# Patient Record
Sex: Male | Born: 1960 | Race: White | Hispanic: No | Marital: Married | Smoking: Current every day smoker
Health system: Southern US, Community
[De-identification: ages and names within clinical notes are randomized; demographics above are authoritative.]

## PROBLEM LIST (undated history)

## (undated) DIAGNOSIS — T7840XA Allergy, unspecified, initial encounter: Secondary | ICD-10-CM

## (undated) DIAGNOSIS — J449 Chronic obstructive pulmonary disease, unspecified: Secondary | ICD-10-CM

## (undated) DIAGNOSIS — F17200 Nicotine dependence, unspecified, uncomplicated: Secondary | ICD-10-CM

## (undated) DIAGNOSIS — Z973 Presence of spectacles and contact lenses: Secondary | ICD-10-CM

## (undated) DIAGNOSIS — I2510 Atherosclerotic heart disease of native coronary artery without angina pectoris: Secondary | ICD-10-CM

## (undated) DIAGNOSIS — M1990 Unspecified osteoarthritis, unspecified site: Secondary | ICD-10-CM

## (undated) DIAGNOSIS — I7090 Unspecified atherosclerosis: Secondary | ICD-10-CM

## (undated) HISTORY — PX: LACERATION REPAIR: SHX5168

## (undated) HISTORY — DX: Nicotine dependence, unspecified, uncomplicated: F17.200

## (undated) HISTORY — DX: Unspecified osteoarthritis, unspecified site: M19.90

## (undated) HISTORY — PX: TONSILLECTOMY AND ADENOIDECTOMY: SHX28

## (undated) HISTORY — DX: Allergy, unspecified, initial encounter: T78.40XA

## (undated) HISTORY — PX: COLONOSCOPY: SHX174

## (undated) HISTORY — DX: Chronic obstructive pulmonary disease, unspecified: J44.9

## (undated) HISTORY — DX: Atherosclerotic heart disease of native coronary artery without angina pectoris: I25.10

## (undated) HISTORY — DX: Presence of spectacles and contact lenses: Z97.3

## (undated) HISTORY — PX: APPENDECTOMY: SHX54

## (undated) HISTORY — DX: Unspecified atherosclerosis: I70.90

---

## 2002-05-05 ENCOUNTER — Ambulatory Visit (HOSPITAL_COMMUNITY): Admission: RE | Admit: 2002-05-05 | Discharge: 2002-05-05 | Payer: Self-pay | Admitting: Internal Medicine

## 2002-05-05 ENCOUNTER — Encounter: Payer: Self-pay | Admitting: Internal Medicine

## 2002-05-20 ENCOUNTER — Observation Stay (HOSPITAL_COMMUNITY): Admission: RE | Admit: 2002-05-20 | Discharge: 2002-05-21 | Payer: Self-pay | Admitting: General Surgery

## 2007-04-09 ENCOUNTER — Ambulatory Visit (HOSPITAL_COMMUNITY): Admission: RE | Admit: 2007-04-09 | Discharge: 2007-04-09 | Payer: Self-pay | Admitting: Family Medicine

## 2007-08-07 HISTORY — PX: CERVICAL FUSION: SHX112

## 2007-08-07 HISTORY — PX: CHOLECYSTECTOMY: SHX55

## 2008-11-03 ENCOUNTER — Ambulatory Visit (HOSPITAL_COMMUNITY): Admission: RE | Admit: 2008-11-03 | Discharge: 2008-11-04 | Payer: Self-pay | Admitting: Orthopaedic Surgery

## 2010-11-16 LAB — CBC
HCT: 47.4 % (ref 39.0–52.0)
Hemoglobin: 16.6 g/dL (ref 13.0–17.0)
RDW: 14.3 % (ref 11.5–15.5)
WBC: 9.8 10*3/uL (ref 4.0–10.5)

## 2010-11-16 LAB — COMPREHENSIVE METABOLIC PANEL
Albumin: 4 g/dL (ref 3.5–5.2)
BUN: 11 mg/dL (ref 6–23)
Chloride: 104 mEq/L (ref 96–112)
Creatinine, Ser: 1.09 mg/dL (ref 0.4–1.5)
Glucose, Bld: 83 mg/dL (ref 70–99)
Total Bilirubin: 1.3 mg/dL — ABNORMAL HIGH (ref 0.3–1.2)
Total Protein: 6.9 g/dL (ref 6.0–8.3)

## 2010-11-16 LAB — URINALYSIS, ROUTINE W REFLEX MICROSCOPIC
Hgb urine dipstick: NEGATIVE
Protein, ur: NEGATIVE mg/dL
Specific Gravity, Urine: 1.015 (ref 1.005–1.030)
Urobilinogen, UA: 1 mg/dL (ref 0.0–1.0)

## 2010-12-19 NOTE — Op Note (Signed)
NAME:  Cameron Mendoza, Cameron Mendoza NO.:  [id number]   MEDICAL RECORD NO.:  [id number]          PATIENT TYPE:  OIB   LOCATION:  5024                         FACILITY:  [facility]   PHYSICIAN:  Mark C. Ophelia Charter, M.D.    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   PROCEDURE:  Left C6-7 anterior cervical diskectomy and fusion, iliac  crest graft.  Cloward technique.   SURGEON:  Mark C. Ophelia Charter, MD   ASSISTANT:  Skip Mayer, PA-C   ANESTHESIA:  General plus 15 mL Marcaine local.   DRAINS:  One Hemovac neck.   BRIEF HISTORY:  A 50 year old male with left C6-7 HNP, radiculopathy,  progressive left triceps weakness, uncontrolled pain despite round-the-  clock pain medication and progressive loss of function of left wrist  with decreased grip strength and decreased wrist flexion strength on the  left.  MRI scan showed large C6-7 HNP with compression.   PROCEDURE:  After induction of general anesthesia, 2-pound sandbag  underneath the neck after intubation, arms tucked to the side with pads  over the ulnar nerve, gel bag underneath the buttocks, left iliac crest,  and neck after application had halter traction without weight was  performed.  Sterile skin marker was used in a primary skin fold and  Betadine biodrape application, sterile Mayo stand at the head, and  thyroid sheet and drape.  Iliac crest and neck had been sealed with  Betadine Steri-Drapes.  Surgical time-out checklist was completed.  Ancef 2 g given prophylactically.  Incision was made to midline,  extended to the left.  Platysma was divided in line of the fibers.  Blunt dissection down to the level of longus coli muscle was performed.  Large spur at C5-6 was noted due to the patient's thick shoulder, short  neck and  size of the needle was first placed at 45 short 25 needle.  Initial x-ray confirmed this.  Going down 2 levels, it was difficult to  find C5-6 due to the large spurs, which were regular and the disk space  was not able to be palpated with the needle.  C6-7 was found also had  some osteophytes anteriorly partially over it.  Due to the gap from C4-5  down to C6-7, two or three additional x-rays were taken and finally x-  ray showed a needle at C4-5.  After removal of the anterior spurs with a  Beyer rongeur, short 25 needle in the C5-6 disk space and then a  Penfield 4 was placed in the C6-7 space.  After microscope was draped  and brought in, the self-retaining Cloward retractors were placed with  smooth blade on the right side, teeth blades on the left, due to the  depth of the hole the deepest teeth blades would not catch the longus  coli well.  Smooth blades were placed up and down, deepest available.  Diskectomy was performed finishing up at C6-7.  Posterior longitudinal  ligament was taken down.  Operative microscope was used for  visualization.  Cloward drill was gradually drilled back to the  posterior cortex, then down slightly with the bur.  Depth gauge  measurements were made.  Extruded disk material through the ligament was  teased out.  Left paracentral at the level of the disk without cephalad  or caudad migration, some of the fragments were a little bit further out  to the left laterally.  Uncovertebral joints were stripped and 1-10 mm  Kerrisons were used to enlarge and remove spurs at the entry into the  foramina.  This was performed on the right side as well.  Dura was  completely visualized all the way across the 12-mm hole and 2-3 mm  lateral into both gutters.  After irrigation with saline solution, 14-mm  plug was harvested from the left iliac crest splitting the fascia in  line with the fibers with initial incision transverse in line with ASIS  2 cm inferior.  Plug was  cut, trimmed, bullet-nosed slightly based on  depth gauge measurement and it was 12-14 mm plug.  It was inserted,  countersunk a millimeter and good stability.  There was right and left  regress of fluids.  After irrigation with saline solution, Hemovac was  placed with in-and-out technique.  There was some mild bleeding noted on  the medial aspect of the wound from a small artery come off the thyroid,  which was coagulated with the bipolar.  It had been preserved at the  beginning of the case, but somewhere in the procedure with manipulation  it detached immediately.  Operative wound was dry.  Hemovac was placed  in an in-and-out technique, 3-0 Vicryl in platysma and 4-0 Vicryl  subcuticular skin closure.  Tincture of Benzoin, Steri-Strips, Marcaine  infiltration, postop dressing.  Iliac crest closed with 0 Vicryl  interrupted in the deep fascia, 2-0 Vicryl subcutaneous tissue, 3-0  Vicryl subcuticular closure.  Tincture of Benzoin, Steri-Strips,  Marcaine infiltration on both incisions, postop dressings, soft cervical  collar, and transferred to recovery room.      Mark C. Ophelia Charter, M.D.  Electronically Signed     MCY/MEDQ  D:  11/03/2008  T:  11/04/2008  Job:  [id number]

## 2010-12-22 NOTE — Op Note (Signed)
NAME:  Cameron Mendoza, Cameron Mendoza NO.:  [id number]   MEDICAL RECORD NO.:  [id number]                   PATIENT TYPE:   LOCATION:                                       FACILITY:  [facility]   PHYSICIAN:  Barbaraann Barthel, MD                DATE OF BIRTH:   DATE OF PROCEDURE:  05/20/2002  DATE OF DISCHARGE:                                 OPERATIVE REPORT   PREOPERATIVE DIAGNOSIS:  Cholecystitis with either cholelithiasis or  gallbladder polyps.   POSTOPERATIVE DIAGNOSIS:  Cholecystitis with either cholelithiasis or  gallbladder polyps.   PROCEDURE:  Laparoscopic cholecystectomy   SURGEON:  Barbaraann Barthel, MD   ASSISTANT:  Tilda Burrow, M.D.   SPECIMEN:  Gallbladder.   INDICATIONS:  This is a 50 year old white male who had multiple episode of  right upper quadrant pain with nausea.  He was seen by the medical service  and referred to me after sonogram revealed the presence of either  cholelithiasis versus small polyps within his gallbladder.  We placed him on  a restricted diet.  We checked his liver function studies and amylase, all  of which were within normal limits; and we planned for elective  cholecystectomy.  We discussed the complications of laparoscopic  cholecystectomy, including, but not limited to bleeding and infection;  damage to bile ducts;  perforation of organs; and transitory diarrhea;  informed consent was obtained.   GROSS OPERATIVE FINDINGS:  The patient had a very fatty, infiltrated  gallbladder, and a small cystic duct which was not cannulated.  The right  upper quadrant otherwise appeared to be normal.  The gallbladder was not  distended and there were moderate adhesions about it.   TECHNIQUE:  The patient was placed in the supine position and after the  adequate administration of general anesthesia via endotracheal intubation, a  Foley catheter was aseptically inserted; and the abdomen was prepped with  Betadine solution and  draped in the usual manner.  A periumbilical incision  was carried out over the superior aspect of the umbilicus, and with the  patient in Trendelenburg, the fascia was grasped with a sharp towel clip.  A  Veress needle was inserted through the fascia and confirmed in position with  the saline drop test.  The abdomen was then insufflated with CO2 to  approximately 3.5 liters.  We then used the Visiport technique and under  direct vision we placed an 11 cannula in the umbilicus incision.  Then under  direct vision with the camera in place 3 other cannulas were placed; an 11-  mm cannula in the epigastrium and two 5-mm cannulas in right upper quadrant  laterally.   The gallbladder was grasped.  Its adhesions were taken down.  The cystic  duct was isolated, clearly visualized, triply silver clipped on the side of  the common bile duct, and singly silver clipped on the side of the  gallbladder and divided.  The cystic artery was also visualized and triply  silver clipped and divided; and the gallbladder was then removed.  There  were some small adhesions which were clipped around the edge of the  gallbladder as well.  The gallbladder was then removed using the hook  cautery device.  This was done uneventfully.  There was minimal oozing from  the gallbladder.  A small amount of oozing near the fundus of the  gallbladder, I elected to leave after cauterizing a piece of Surgicel in  this area.   After irrigating and checking for hemostasis, the gallbladder was then  desufflated and the cannula sites were the 11-mm cannulas were placed were  repaired suturing the fascia with #0 Polysorb sutures in interrupted  fashion.  I used approximately 4 or 5 cc of 1/2% Sensorcaine around these  incisions to help with postoperative pain as well.  All incisions were then  closed with surgical clips and a sterile dressing was applied.  No drains  were placed.   Prior to closure, all sponge, needle, and  instrument counts were found to be  correct.  Estimated blood loss was minimal.  The patient received 1750 cc of  crystalloids intraoperatively.  There were no complications.                                               Barbaraann Barthel, MD    WB/MEDQ  D:  05/20/2002  T:  05/20/2002  Job:  [id number]   cc:   Madelin Rear. Sherwood Gambler, M.D.  [address]  [place]  [place] [address]  Fax: [phone]   Tilda Burrow, M.D.

## 2016-10-12 DIAGNOSIS — J40 Bronchitis, not specified as acute or chronic: Secondary | ICD-10-CM | POA: Diagnosis not present

## 2016-10-12 DIAGNOSIS — J111 Influenza due to unidentified influenza virus with other respiratory manifestations: Secondary | ICD-10-CM | POA: Diagnosis not present

## 2018-05-08 ENCOUNTER — Ambulatory Visit (INDEPENDENT_AMBULATORY_CARE_PROVIDER_SITE_OTHER): Payer: BLUE CROSS/BLUE SHIELD | Admitting: Medical

## 2018-05-08 ENCOUNTER — Encounter: Payer: Self-pay | Admitting: Medical

## 2018-05-08 VITALS — BP 130/90 | HR 92 | Temp 97.8°F | Resp 16 | Ht 71.5 in | Wt 231.4 lb

## 2018-05-08 DIAGNOSIS — Z125 Encounter for screening for malignant neoplasm of prostate: Secondary | ICD-10-CM | POA: Diagnosis not present

## 2018-05-08 DIAGNOSIS — Z129 Encounter for screening for malignant neoplasm, site unspecified: Secondary | ICD-10-CM

## 2018-05-08 DIAGNOSIS — Z136 Encounter for screening for cardiovascular disorders: Secondary | ICD-10-CM | POA: Diagnosis not present

## 2018-05-08 DIAGNOSIS — Z1211 Encounter for screening for malignant neoplasm of colon: Secondary | ICD-10-CM

## 2018-05-08 DIAGNOSIS — E669 Obesity, unspecified: Secondary | ICD-10-CM

## 2018-05-08 DIAGNOSIS — Z23 Encounter for immunization: Secondary | ICD-10-CM | POA: Diagnosis not present

## 2018-05-08 DIAGNOSIS — R0989 Other specified symptoms and signs involving the circulatory and respiratory systems: Secondary | ICD-10-CM | POA: Insufficient documentation

## 2018-05-08 DIAGNOSIS — Z720 Tobacco use: Secondary | ICD-10-CM | POA: Insufficient documentation

## 2018-05-08 DIAGNOSIS — F17200 Nicotine dependence, unspecified, uncomplicated: Secondary | ICD-10-CM | POA: Diagnosis not present

## 2018-05-08 DIAGNOSIS — Z0000 Encounter for general adult medical examination without abnormal findings: Secondary | ICD-10-CM | POA: Diagnosis not present

## 2018-05-08 DIAGNOSIS — R490 Dysphonia: Secondary | ICD-10-CM

## 2018-05-08 DIAGNOSIS — Z1159 Encounter for screening for other viral diseases: Secondary | ICD-10-CM | POA: Diagnosis not present

## 2018-05-08 LAB — POCT URINALYSIS DIP (PROADVANTAGE DEVICE)
Bilirubin, UA: NEGATIVE
Blood, UA: NEGATIVE
Glucose, UA: NEGATIVE mg/dL
Ketones, POC UA: NEGATIVE mg/dL
LEUKOCYTES UA: NEGATIVE
Nitrite, UA: NEGATIVE
PH UA: 6 (ref 5.0–8.0)
PROTEIN UA: NEGATIVE mg/dL
Specific Gravity, Urine: 1.02
Urobilinogen, Ur: NEGATIVE

## 2018-05-08 NOTE — Patient Instructions (Addendum)
It was a pleasure meeting you today  Since you have not had a physical or health care in a while, you are due for several screenings and recommendations as below  Given history of tobacco use, we updated your flu and pneumonia vaccines today  I strongly recommend you quit smoking  I recommend you call your insurance to see if they will cover a chest CT lung cancer screening.  If they will let me know and we will set this up  I am concerned about your lung sounds and your hoarse voice  When someone has a chronically hoarse voice we will usually refer to ear nose and throat to do an evaluation to make sure there is no tumor or other abnormalities of the vocal cords.  Consider this and let me know about possible referral  Your lung sounds or abnormal, and I suspect you may have some lung disease related to smoking  Let us plan to have you return at your convenience to do a breathing test called spirometry to check your lung volumes  If your insurance does not cover the lung CT screening we will plan to do a chest x-ray   We are checking routine labs today including diabetes screen, cholesterol screen, hepatitis screen.  We will call with lab results   Please call your insurance to see if they cover Cologuard or colonoscopy.  If your blood work comes back showing no anemia then you may do either test which ever you prefer   Vaccines: Shingles vaccine:  I recommend you have a shingles vaccine to help prevent shingles or herpes zoster outbreak.   Please call your insurer to inquire about coverage for the Shingrix vaccine given in 2 doses.   Some insurers cover this vaccine after age 18, some cover this after age 55.  If your insurer covers this, then call to schedule appointment to have this vaccine here.  At your convenience, you can return for tetanus booster.        Yearly screenings See your eye doctor yearly for routine vision care. See your dentist yearly for routine dental care  including hygiene visits twice yearly. See me here yearly for a routine physical and preventative care visit     Please follow up yearly for a physical.   Preventative Care for Adults - Male      MAINTAIN REGULAR HEALTH EXAMS:  A routine yearly physical is a good way to check in with your primary care provider about your health and preventive screening. It is also an opportunity to share updates about your health and any concerns you have, and receive a thorough all-over exam.   Most health insurance companies pay for at least some preventative services.  Check with your health plan for specific coverages.  WHAT PREVENTATIVE SERVICES DO WOMEN NEED?  Adult men should have their weight and blood pressure checked regularly.   Men age 39 and older should have their cholesterol levels checked regularly.  Beginning at age 77 and continuing to age 15, men should be screened for colorectal cancer.  Certain people may need continued testing until age 75.  Updating vaccinations is part of preventative care.  Vaccinations help protect against diseases such as the flu.  Osteoporosis is a disease in which the bones lose minerals and strength as we age. Men ages 81 and over should discuss this with their caregivers  Lab tests are generally done as part of preventative care to screen for anemia and blood disorders,  to screen for problems with the kidneys and liver, to screen for bladder problems, to check blood sugar, and to check your cholesterol level.  Preventative services generally include counseling about diet, exercise, avoiding tobacco, drugs, excessive alcohol consumption, and sexually transmitted infections.    GENERAL RECOMMENDATIONS FOR GOOD HEALTH:  Healthy diet:  Eat a variety of foods, including fruit, vegetables, animal or vegetable protein, such as meat, fish, chicken, and eggs, or beans, lentils, tofu, and grains, such as rice.  Drink plenty of water daily.  Decrease  saturated fat in the diet, avoid lots of red meat, processed foods, sweets, fast foods, and fried foods.  Exercise:  Aerobic exercise helps maintain good heart health. At least 30-40 minutes of moderate-intensity exercise is recommended. For example, a brisk walk that increases your heart rate and breathing. This should be done on most days of the week.   Find a type of exercise or a variety of exercises that you enjoy so that it becomes a part of your daily life.  Examples are running, walking, swimming, water aerobics, and biking.  For motivation and support, explore group exercise such as aerobic class, spin class, Zumba, Yoga,or  martial arts, etc.    Set exercise goals for yourself, such as a certain weight goal, walk or run in a race such as a 5k walk/run.  Speak to your primary care provider about exercise goals.  Disease prevention:  If you smoke or chew tobacco, find out from your caregiver how to quit. It can literally save your life, no matter how long you have been a tobacco user. If you do not use tobacco, never begin.   Maintain a healthy diet and normal weight. Increased weight leads to problems with blood pressure and diabetes.   The Body Mass Index or BMI is a way of measuring how much of your body is fat. Having a BMI above 27 increases the risk of heart disease, diabetes, hypertension, stroke and other problems related to obesity. Your caregiver can help determine your BMI and based on it develop an exercise and dietary program to help you achieve or maintain this important measurement at a healthful level.  High blood pressure causes heart and blood vessel problems.  Persistent high blood pressure should be treated with medicine if weight loss and exercise do not work.   Fat and cholesterol leaves deposits in your arteries that can block them. This causes heart disease and vessel disease elsewhere in your body.  If your cholesterol is found to be high, or if you have heart  disease or certain other medical conditions, then you may need to have your cholesterol monitored frequently and be treated with medication.   Ask if you should have a cardiac stress test if your history suggests this. A stress test is a test done on a treadmill that looks for heart disease. This test can find disease prior to there being a problem.  Osteoporosis is a disease in which the bones lose minerals and strength as we age. This can result in serious bone fractures. Risk of osteoporosis can be identified using a bone density scan. Men ages 22 and over should discuss this with their caregivers. Ask your caregiver whether you should be taking a calcium supplement and Vitamin D, to reduce the rate of osteoporosis.   Avoid drinking alcohol in excess (more than two drinks per day).  Avoid use of street drugs. Do not share needles with anyone. Ask for professional help if you need  assistance or instructions on stopping the use of alcohol, cigarettes, and/or drugs.  Brush your teeth twice a day with fluoride toothpaste, and floss once a day. Good oral hygiene prevents tooth decay and gum disease. The problems can be painful, unattractive, and can cause other health problems. Visit your dentist for a routine oral and dental check up and preventive care every 6-12 months.   Look at your skin regularly.  Use a mirror to look at your back. Notify your caregivers of changes in moles, especially if there are changes in shapes, colors, a size larger than a pencil eraser, an irregular border, or development of new moles.  Safety:  Use seatbelts 100% of the time, whether driving or as a passenger.  Use safety devices such as hearing protection if you work in environments with loud noise or significant background noise.  Use safety glasses when doing any work that could send debris in to the eyes.  Use a helmet if you ride a bike or motorcycle.  Use appropriate safety gear for contact sports.  Talk to your  caregiver about gun safety.  Use sunscreen with a SPF (or skin protection factor) of 15 or greater.  Lighter skinned people are at a greater risk of skin cancer. Don't forget to also wear sunglasses in order to protect your eyes from too much damaging sunlight. Damaging sunlight can accelerate cataract formation.   Practice safe sex. Use condoms. Condoms are used for birth control and to help reduce the spread of sexually transmitted infections (or STIs).  Some of the STIs are gonorrhea (the clap), chlamydia, syphilis, trichomonas, herpes, HPV (human papilloma virus) and HIV (human immunodeficiency virus) which causes AIDS. The herpes, HIV and HPV are viral illnesses that have no cure. These can result in disability, cancer and death.   Keep carbon monoxide and smoke detectors in your home functioning at all times. Change the batteries every 6 months or use a model that plugs into the wall.   Vaccinations:  Stay up to date with your tetanus shots and other required immunizations. You should have a booster for tetanus every 10 years. Be sure to get your flu shot every year, since 5%-20% of the [place] population comes down with the flu. The flu vaccine changes each year, so being vaccinated once is not enough. Get your shot in the fall, before the flu season peaks.   Other vaccines to consider:  Human Papilloma Virus or HPV causes cancer of the cervix, and other infections that can be transmitted from person to person. There is a vaccine for HPV, and males should get immunized between the ages of 65 and 81. It requires a series of 3 shots.   Pneumococcal vaccine to protect against certain types of pneumonia.  This is normally recommended for adults age 55 or older.  However, adults younger than 58 years old with certain underlying conditions such as diabetes, heart or lung disease should also receive the vaccine.  Shingles vaccine to protect against Varicella Zoster if you are older than age 64, or  younger than 58 years old with certain underlying illness.  If you have not had the Shingrix vaccine, please call your insurer to inquire about coverage for the Shingrix vaccine given in 2 doses.   Some insurers cover this vaccine after age 58, some cover this after age 56.  If your insurer covers this, then call to schedule appointment to have this vaccine here  Hepatitis A vaccine to protect against a form  of infection of the liver by a virus acquired from food.  Hepatitis B vaccine to protect against a form of infection of the liver by a virus acquired from blood or body fluids, particularly if you work in health care.  If you plan to travel internationally, check with your local health department for specific vaccination recommendations.   What should I know about cancer screening? Many types of cancers can be detected early and may often be prevented. Lung Cancer  You should be screened every year for lung cancer if: ? You are a current smoker who has smoked for at least 30 years. ? You are a former smoker who has quit within the past 15 years.  Talk to your health care provider about your screening options, when you should start screening, and how often you should be screened.  Colorectal Cancer  Routine colorectal cancer screening usually begins at 58 years of age and should be repeated every 5-10 years until you are 58 years old. You may need to be screened more often if early forms of precancerous polyps or small growths are found. Your health care provider may recommend screening at an earlier age if you have risk factors for colon cancer.  Your health care provider may recommend using home test kits to check for hidden blood in the stool.  A small camera at the end of a tube can be used to examine your colon (sigmoidoscopy or colonoscopy). This checks for the earliest forms of colorectal cancer.  Prostate and Testicular Cancer  Depending on your age and overall health, your  health care provider may do certain tests to screen for prostate and testicular cancer.  Talk to your health care provider about any symptoms or concerns you have about testicular or prostate cancer.  Skin Cancer  Check your skin from head to toe regularly.  Tell your health care provider about any new moles or changes in moles, especially if: ? There is a change in a mole's size, shape, or color. ? You have a mole that is larger than a pencil eraser.  Always use sunscreen. Apply sunscreen liberally and repeat throughout the day.  Protect yourself by wearing long sleeves, pants, a wide-brimmed hat, and sunglasses when outside.

## 2018-05-08 NOTE — Progress Notes (Signed)
Subjective:   HPI  Cameron Mendoza is a 57 y.o. male who presents for new patient physical Chief Complaint  Patient presents with  . NP    NP CPE non fasting    eye exam 2019   Has not been to a doctor in a while no physical in probably 10 years.  Medical care team includes: Tysinger, Kermit Balo, PA-C here for primary care Dentist Eye doctor  Concerns: None particularly, wife made him come in  He notes in remote past possibly being told he had hepatitis, but no prior therapy  Reviewed their medical, surgical, family, social, medication, and allergy history and updated chart as appropriate.  Past Medical History:  Diagnosis Date  . Allergy   . Arthritis   . Smoker   . Wears glasses      Social History   Socioeconomic History  . Marital status: Married    Spouse name: Not on file  . Number of children: 4  . Years of education: Not on file  . Highest education level: Not on file  Occupational History  . Occupation: Retail banker    Comment: Engineer, water  Social Needs  . Financial resource strain: Not on file  . Food insecurity:    Worry: Not on file    Inability: Not on file  . Transportation needs:    Medical: Not on file    Non-medical: Not on file  Tobacco Use  . Smoking status: Current Every Day Smoker    Packs/day: 1.50    Years: 25.00    Pack years: 37.50  . Smokeless tobacco: Never Used  Substance and Sexual Activity  . Alcohol use: Yes    Alcohol/week: 2.0 standard drinks    Types: 2 Cans of beer per week  . Drug use: Never  . Sexual activity: Not on file  Lifestyle  . Physical activity:    Days per week: 3 days    Minutes per session: 30 min  . Stress: Not on file  Relationships  . Social connections:    Talks on phone: Not on file    Gets together: Not on file    Attends religious service: Not on file    Active member of club or organization: Not on file    Attends meetings of clubs or organizations: Not on file    Relationship  status: Not on file  . Intimate partner violence:    Fear of current or ex partner: Not on file    Emotionally abused: Not on file    Physically abused: Not on file    Forced sexual activity: Not on file  Other Topics Concern  . Not on file  Social History Narrative   [employer]    Family History  Problem Relation Age of Onset  . COPD Mother   . Hypertension Mother   . Diabetes Father   . Heart disease Father 63       CHF  . Diabetes Brother     No current outpatient medications on file.  Allergies  Allergen Reactions  . Penicillins Rash     Review of Systems Constitutional: -fever, -chills, -sweats, -unexpected weight change, -decreased appetite, -fatigue Allergy: -sneezing, -itching, -congestion Dermatology: -changing moles, --rash, -lumps ENT: -runny nose, -ear pain, -sore throat, -hoarseness, -sinus pain, -teeth pain, - ringing in ears, -hearing loss, -nosebleeds Cardiology: -chest pain, -palpitations, -swelling, -difficulty breathing when lying flat, -waking up short of breath Respiratory: -cough, -shortness of breath, -difficulty breathing with exercise or  exertion, -wheezing, -coughing up blood Gastroenterology: -abdominal pain, -nausea, -vomiting, -diarrhea, -constipation, -blood in stool, -changes in bowel movement, -difficulty swallowing or eating Hematology: -bleeding, -bruising  Musculoskeletal: -joint aches, -muscle aches, -joint swelling, -back pain, -neck pain, -cramping, -changes in gait Ophthalmology: denies vision changes, eye redness, itching, discharge Urology: -burning with urination, -difficulty urinating, -blood in urine, -urinary frequency, -urgency, -incontinence Neurology: -headache, -weakness, -tingling, -numbness, -memory loss, -falls, -dizziness Psychology: -depressed mood, -agitation, -sleep problems Male GU: no testicular mass, pain, no lymph nodes swollen, no swelling, no rash.     Objective:  BP 130/90   Pulse 92   Temp 97.8 F (36.6  C) (Oral)   Resp 16   Ht 5' 11.5" (1.816 m)   Wt 231 lb 6.4 oz (105 kg)   SpO2 96%   BMI 31.82 kg/m   General appearance: alert, no distress, WD/WN, Caucasian male, somewhat hoarse voice Skin: few scattered macules, no worrisome lesions, pink/red telangectasia of right cheek unchanged per patient for >1 year HEENT: normocephalic, conjunctiva/corneas normal, sclerae anicteric, PERRLA, EOMi, nares patent, no discharge or erythema, pharynx normal Oral cavity: MMM, tongue normal, teeth in good repair Neck: supple, no lymphadenopathy, no thyromegaly, no masses, normal ROM, no bruits Chest: non tender, normal shape and expansion Heart: RRR, normal S1, S2, no murmurs Lungs: decreased lung sounds and faint generalized wheezes, no rhonchi or rales Abdomen: +bs, soft, non tender, non distended, no masses, no hepatomegaly, no splenomegaly, no bruits Back: non tender, normal ROM, no scoliosis Musculoskeletal: upper extremities non tender, no obvious deformity, normal ROM throughout, lower extremities non tender, no obvious deformity, normal ROM throughout Extremities: no edema, no cyanosis, no clubbing Pulses: 2+ symmetric, upper and lower extremities, normal cap refill Neurological: alert, oriented x 3, CN2-12 intact, strength normal upper extremities and lower extremities, sensation normal throughout, DTRs 2+ throughout, no cerebellar signs, gait normal Psychiatric: normal affect, behavior normal, pleasant  GU: normal male external genitalia,circumcised, nontender, no masses, no hernia, no lymphadenopathy Rectal: anus normal tone, prostate moderately enlarged, no nodules   Adult ECG Report  Indication: physical  Rate: 85 bpm  Rhythm: normal sinus rhythm  QRS Axis: -13 degrees  PR Interval:  QRS Duration:  QTc:  Conduction Disturbances: incomplete RBBB  Other Abnormalities: none  Patient's cardiac risk factors are: male gender, obesity (BMI >= 30 kg/m2) and smoking/  tobacco exposure.  EKG comparison: none  Narrative Interpretation: incomplete RBBB     Assessment and Plan :   Encounter Diagnoses  Name Primary?  . Routine general medical examination at a health care facility Yes  . Smoker   . Screening for prostate cancer   . Screen for colon cancer   . Abnormal lung sounds   . Hoarse voice quality   . Need for influenza vaccination   . Need for pneumococcal vaccination   . Screening for heart disease   . Obesity with serious comorbidity, unspecified classification, unspecified obesity type   . Encounter for hepatitis C screening test for low risk patient   . Screening for cancer     Physical exam - discussed and counseled on healthy lifestyle, diet, exercise, preventative care, vaccinations, sick and well care, proper use of emergency dept and after hours care, and addressed their concerns.    Health screening: Given exam findings or tobacco history, consider AAA screening.  We discussed the following: Since you have not had a physical or health care in a while, you are due for several screenings and  recommendations as below  Given history of tobacco use, we updated your flu and pneumonia vaccines today  I strongly recommend you quit smoking  I recommend you call your insurance to see if they will cover a chest CT lung cancer screening.  If they will let me know and we will set this up  I am concerned about your lung sounds and your hoarse voice  When someone has a chronically hoarse voice we will usually refer to ear nose and throat to do an evaluation to make sure there is no tumor or other abnormalities of the vocal cords.  Consider this and let me know about possible referral  Your lung sounds or abnormal, and I suspect you may have some lung disease related to smoking  Let us plan to have you return at your convenience to do a breathing test called spirometry to check your lung volumes  If your insurance does not cover the lung  CT screening we will plan to do a chest x-ray   We are checking routine labs today including diabetes screen, cholesterol screen, hepatitis screen.  We will call with lab results   Please call your insurance to see if they cover Cologuard or colonoscopy.  If your blood work comes back showing no anemia then you may do either test which ever you prefer   Vaccines: Shingles vaccine:  I recommend you have a shingles vaccine to help prevent shingles or herpes zoster outbreak.   Please call your insurer to inquire about coverage for the Shingrix vaccine given in 2 doses.   Some insurers cover this vaccine after age 37, some cover this after age 86.  If your insurer covers this, then call to schedule appointment to have this vaccine here.  At your convenience, you can return for tetanus booster.     Counseled on the influenza virus vaccine.  Vaccine information sheet given.  Influenza vaccine given after consent obtained.  Counseled on the pneumococcal vaccine.  Vaccine information sheet given.  Pneumococcal vaccine PPSV23 given after consent obtained.   Meldrick was seen today for np.  Diagnoses and all orders for this visit:  Routine general medical examination at a health care facility -     POCT Urinalysis DIP (Proadvantage Device) -     Comprehensive metabolic panel -     CBC with Differential/Platelet -     Lipid panel -     PSA -     Hemoglobin A1c -     EKG 12-Lead -     HIV Antibody (routine testing w rflx) -     Hepatitis C antibody -     Hepatitis B surface antigen  Smoker  Screening for prostate cancer -     PSA  Screen for colon cancer  Abnormal lung sounds  Hoarse voice quality  Need for influenza vaccination -     Flu Vaccine QUAD 6+ mos PF IM (Fluarix Quad PF)  Need for pneumococcal vaccination -     Pneumococcal polysaccharide vaccine 23-valent greater than or equal to 2yo subcutaneous/IM  Screening for heart disease -     EKG 12-Lead  Obesity with  serious comorbidity, unspecified classification, unspecified obesity type -     Hemoglobin A1c  Encounter for hepatitis C screening test for low risk patient -     Hepatitis C antibody  Screening for cancer    Follow-up pending labs, yearly for physical

## 2018-05-09 ENCOUNTER — Other Ambulatory Visit: Payer: Self-pay | Admitting: Medical

## 2018-05-09 DIAGNOSIS — Z122 Encounter for screening for malignant neoplasm of respiratory organs: Secondary | ICD-10-CM

## 2018-05-09 LAB — COMPREHENSIVE METABOLIC PANEL
ALT: 21 IU/L (ref 0–44)
AST: 16 IU/L (ref 0–40)
Albumin/Globulin Ratio: 1.4 (ref 1.2–2.2)
Albumin: 4.4 g/dL (ref 3.5–5.5)
Alkaline Phosphatase: 69 IU/L (ref 39–117)
BUN/Creatinine Ratio: 8 — ABNORMAL LOW (ref 9–20)
BUN: 10 mg/dL (ref 6–24)
Bilirubin Total: 1.1 mg/dL (ref 0.0–1.2)
CALCIUM: 9.5 mg/dL (ref 8.7–10.2)
CO2: 21 mmol/L (ref 20–29)
CREATININE: 1.23 mg/dL (ref 0.76–1.27)
Chloride: 104 mmol/L (ref 96–106)
GFR, EST AFRICAN AMERICAN: 74 mL/min/{1.73_m2} (ref >59)
GFR, EST NON AFRICAN AMERICAN: 64 mL/min/{1.73_m2} (ref >59)
Globulin, Total: 3.1 g/dL (ref 1.5–4.5)
Glucose: 88 mg/dL (ref 65–99)
POTASSIUM: 4.6 mmol/L (ref 3.5–5.2)
Sodium: 141 mmol/L (ref 134–144)
TOTAL PROTEIN: 7.5 g/dL (ref 6.0–8.5)

## 2018-05-09 LAB — CBC WITH DIFFERENTIAL/PLATELET
BASOS: 0 %
Basophils Absolute: 0 10*3/uL (ref 0.0–0.2)
EOS (ABSOLUTE): 0.1 10*3/uL (ref 0.0–0.4)
EOS: 2 %
HEMOGLOBIN: 15.5 g/dL (ref 13.0–17.7)
Hematocrit: 45 % (ref 37.5–51.0)
Immature Grans (Abs): 0 10*3/uL (ref 0.0–0.1)
Immature Granulocytes: 0 %
LYMPHS: 29 %
Lymphocytes Absolute: 1.9 10*3/uL (ref 0.7–3.1)
MCH: 32.6 pg (ref 26.6–33.0)
MCHC: 34.4 g/dL (ref 31.5–35.7)
MCV: 95 fL (ref 79–97)
MONOCYTES: 10 %
Monocytes Absolute: 0.7 10*3/uL (ref 0.1–0.9)
NEUTROS PCT: 59 %
Neutrophils Absolute: 3.9 10*3/uL (ref 1.4–7.0)
Platelets: 262 10*3/uL (ref 150–450)
RBC: 4.76 x10E6/uL (ref 4.14–5.80)
RDW: 14.1 % (ref 12.3–15.4)
WBC: 6.7 10*3/uL (ref 3.4–10.8)

## 2018-05-09 LAB — LIPID PANEL
CHOL/HDL RATIO: 3.8 ratio (ref 0.0–5.0)
Cholesterol, Total: 177 mg/dL (ref 100–199)
HDL: 46 mg/dL (ref >39)
LDL CALC: 108 mg/dL — AB (ref 0–99)
Triglycerides: 115 mg/dL (ref 0–149)
VLDL Cholesterol Cal: 23 mg/dL (ref 5–40)

## 2018-05-09 LAB — HEPATITIS C ANTIBODY: Hep C Virus Ab: <0.1 s/co ratio (ref 0.0–0.9)

## 2018-05-09 LAB — HEMOGLOBIN A1C
ESTIMATED AVERAGE GLUCOSE: 117 mg/dL
Hgb A1c MFr Bld: 5.7 % — ABNORMAL HIGH (ref 4.8–5.6)

## 2018-05-09 LAB — HIV ANTIBODY (ROUTINE TESTING W REFLEX): HIV Screen 4th Generation wRfx: NONREACTIVE

## 2018-05-09 LAB — HEPATITIS B SURFACE ANTIGEN: HEP B S AG: NEGATIVE

## 2018-05-09 LAB — PSA: Prostate Specific Ag, Serum: 0.6 ng/mL (ref 0.0–4.0)

## 2018-05-12 ENCOUNTER — Telehealth: Payer: Self-pay

## 2018-05-12 NOTE — Telephone Encounter (Signed)
See prior note

## 2018-05-13 ENCOUNTER — Other Ambulatory Visit: Payer: Self-pay | Admitting: Medical

## 2018-05-13 NOTE — Telephone Encounter (Signed)
I think there may have been confusion on the scan for CT lung cancer screen.  Please see if we simply used screen for "lung cancer, smoker, asymptomatic".   This is the correct diagnosis for CT screen

## 2018-05-13 NOTE — Telephone Encounter (Signed)
I changed diagnosis code to z13.9 and it was approved.

## 2018-05-27 ENCOUNTER — Other Ambulatory Visit (INDEPENDENT_AMBULATORY_CARE_PROVIDER_SITE_OTHER): Payer: BLUE CROSS/BLUE SHIELD

## 2018-05-27 ENCOUNTER — Ambulatory Visit
Admission: RE | Admit: 2018-05-27 | Discharge: 2018-05-27 | Disposition: A | Discharge status: Home / Self Care | Payer: BLUE CROSS/BLUE SHIELD | Source: Ambulatory Visit | Attending: Medical | Admitting: Medical

## 2018-05-27 DIAGNOSIS — Z122 Encounter for screening for malignant neoplasm of respiratory organs: Secondary | ICD-10-CM

## 2018-05-27 DIAGNOSIS — Z23 Encounter for immunization: Secondary | ICD-10-CM | POA: Diagnosis not present

## 2018-05-27 DIAGNOSIS — F17210 Nicotine dependence, cigarettes, uncomplicated: Secondary | ICD-10-CM | POA: Diagnosis not present

## 2018-05-29 ENCOUNTER — Telehealth: Payer: Self-pay | Admitting: Medical

## 2018-05-29 ENCOUNTER — Ambulatory Visit: Payer: BLUE CROSS/BLUE SHIELD | Admitting: Medical

## 2018-05-29 ENCOUNTER — Encounter: Payer: Self-pay | Admitting: Medical

## 2018-05-29 VITALS — BP 130/100 | HR 63 | Temp 98.0°F | Resp 16 | Ht 71.5 in | Wt 234.8 lb

## 2018-05-29 DIAGNOSIS — Z1211 Encounter for screening for malignant neoplasm of colon: Secondary | ICD-10-CM

## 2018-05-29 DIAGNOSIS — R030 Elevated blood-pressure reading, without diagnosis of hypertension: Secondary | ICD-10-CM | POA: Insufficient documentation

## 2018-05-29 DIAGNOSIS — R911 Solitary pulmonary nodule: Secondary | ICD-10-CM

## 2018-05-29 DIAGNOSIS — I2510 Atherosclerotic heart disease of native coronary artery without angina pectoris: Secondary | ICD-10-CM | POA: Diagnosis not present

## 2018-05-29 DIAGNOSIS — R0989 Other specified symptoms and signs involving the circulatory and respiratory systems: Secondary | ICD-10-CM | POA: Diagnosis not present

## 2018-05-29 DIAGNOSIS — R05 Cough: Secondary | ICD-10-CM | POA: Insufficient documentation

## 2018-05-29 DIAGNOSIS — I700 Atherosclerosis of aorta: Secondary | ICD-10-CM | POA: Insufficient documentation

## 2018-05-29 DIAGNOSIS — Z136 Encounter for screening for cardiovascular disorders: Secondary | ICD-10-CM

## 2018-05-29 DIAGNOSIS — R053 Chronic cough: Secondary | ICD-10-CM | POA: Insufficient documentation

## 2018-05-29 DIAGNOSIS — Z720 Tobacco use: Secondary | ICD-10-CM | POA: Diagnosis not present

## 2018-05-29 DIAGNOSIS — R490 Dysphonia: Secondary | ICD-10-CM

## 2018-05-29 MED ORDER — UMECLIDINIUM-VILANTEROL 62.5-25 MCG/INH IN AEPB: 1.0000 | INHALATION_SPRAY | Freq: Every day | RESPIRATORY_TRACT | 0 refills | Status: DC

## 2018-05-29 MED ORDER — ROSUVASTATIN CALCIUM 20 MG PO TABS: 20.0000 mg | ORAL_TABLET | Freq: Every day | ORAL | 3 refills | Status: AC

## 2018-05-29 MED ORDER — VARENICLINE TARTRATE 0.5 MG X 11 & 1 MG X 42 PO MISC: ORAL | 0 refills | Status: DC

## 2018-05-29 NOTE — Telephone Encounter (Signed)
Refer for cologard 

## 2018-05-29 NOTE — Telephone Encounter (Signed)
See message below about Cologuard referral  I forgot to talk to him about his blood pressure today which was elevated the last 2 visits.  I would like him to check his blood pressure at [facility] or [facility] twice weekly for the next few weeks to see if it is staying elevated or not.

## 2018-05-29 NOTE — Progress Notes (Signed)
Subjective: Chief Complaint  Patient presents with  . discuss CT    discuss CT    Here with wife today for follow-up from recent visit.  Accompanied today with his wife  I saw him recently as a new patient for physical.  He went for CT chest for lung cancer screening given long-term history of smoking for at least 30 years.  He also had decreased breath sounds and abnormal lung fields last visit.  He did come in recently for updated vaccines  Past Medical History:  Diagnosis Date  . Allergy   . Arthritis   . Smoker   . Wears glasses    No current outpatient medications on file prior to visit.   No current facility-administered medications on file prior to visit.    ROS as in subjective    Objective: BP (!) 130/100   Pulse 63   Temp 98 F (36.7 C) (Oral)   Resp 16   Ht 5' 11.5" (1.816 m)   Wt 234 lb 12.8 oz (106.5 kg)   SpO2 96%   BMI 32.29 kg/m   Wt Readings from Last 3 Encounters:  05/29/18 234 lb 12.8 oz (106.5 kg)  05/08/18 231 lb 6.4 oz (105 kg)   BP Readings from Last 3 Encounters:  05/29/18 (!) 130/100  05/08/18 130/90   Gudena Gen: wd, wn, nad   otherwise not examined    Assessment: Encounter Diagnoses  Name Primary?  . Pulmonary nodule Yes  . Abnormal lung sounds   . Tobacco use   . Coronary artery disease involving native coronary artery of native heart without angina pectoris   . Chronic cough   . Aortic atherosclerosis (HCC)   . Hoarse voice quality   . Screening for AAA (abdominal aortic aneurysm)   . Screen for colon cancer   . Elevated blood pressure reading without diagnosis of hypertension     Plan: Discussed recent CT chest findings including pulmonary nodule.  I will consult with pulmonology on recommendations for next steps.  Abnormal lung sounds-PFT abnormal today, begin trial of Anoro for COPD.  Discussed symptoms and diagnosis, treatment recommendations.  The goal right now is to see if the chronic cough improves and to see  if he notices any change in exercise tolerance  Tobacco use-discussed the risks of tobacco use, he is willing to try Chantix.  Discussed risk and benefits of medicine, follow-up in 1 month  Coronary artery disease and aortic atherosclerosis noted on chest CT- counseled on diet, exercise, need to quit smoking recommended he begin statin  Hoarse voice-consider ENT consult going forward  Consider aortic aneurysm screening once we determine next steps for other issues above  Referral for Cologuard today  Elevated blood pressure- work on lifestyle changes, monitor blood pressures over the next few weeks  Cameron Mendoza was seen today for discuss ct.  Diagnoses and all orders for this visit:  Pulmonary nodule  Abnormal lung sounds -     Spirometry with graph; Future -     Spirometry with graph  Tobacco use  Coronary artery disease involving native coronary artery of native heart without angina pectoris  Chronic cough  Aortic atherosclerosis (HCC)  Hoarse voice quality  Screening for AAA (abdominal aortic aneurysm)  Screen for colon cancer  Elevated blood pressure reading without diagnosis of hypertension  Other orders -     varenicline (CHANTIX STARTING MONTH PAK) 0.5 MG X 11 & 1 MG X 42 tablet; Take one 0.5 mg tablet by mouth  once daily for 3 days, then increase to one 0.5 mg tablet twice daily for 4 days, then increase to one 1 mg tablet twice daily. -     umeclidinium-vilanterol (ANORO ELLIPTA) 62.5-25 MCG/INH AEPB; Inhale 1 puff into the lungs daily. -     rosuvastatin (CRESTOR) 20 MG tablet; Take 1 tablet (20 mg total) by mouth daily.

## 2018-05-30 ENCOUNTER — Other Ambulatory Visit: Payer: Self-pay

## 2018-05-30 DIAGNOSIS — Z1211 Encounter for screening for malignant neoplasm of colon: Secondary | ICD-10-CM

## 2018-05-30 NOTE — Telephone Encounter (Signed)
Patient notified of all recommendations.

## 2018-05-30 NOTE — Telephone Encounter (Signed)
Left message on voicemail for patient to call back. 

## 2018-06-02 ENCOUNTER — Telehealth: Payer: Self-pay | Admitting: Medical

## 2018-06-02 ENCOUNTER — Ambulatory Visit: Payer: Self-pay | Admitting: Medical

## 2018-06-02 NOTE — Telephone Encounter (Signed)
Review last week's notes

## 2018-07-10 ENCOUNTER — Telehealth: Payer: Self-pay | Admitting: Medical

## 2018-07-10 ENCOUNTER — Other Ambulatory Visit: Payer: Self-pay

## 2018-07-10 NOTE — Telephone Encounter (Signed)
Is this okay to refill? 

## 2018-07-10 NOTE — Telephone Encounter (Signed)
I received a refill request for Chantix.  Did he quit smoking?  Is he requesting a refill on this medication to continue?

## 2018-07-14 ENCOUNTER — Telehealth: Payer: Self-pay | Admitting: Medical

## 2018-07-14 ENCOUNTER — Other Ambulatory Visit: Payer: Self-pay | Admitting: Medical

## 2018-07-14 ENCOUNTER — Other Ambulatory Visit: Payer: Self-pay

## 2018-07-14 MED ORDER — VARENICLINE TARTRATE 0.5 MG PO TABS: 0.5000 mg | ORAL_TABLET | Freq: Two times a day (BID) | ORAL | 0 refills | Status: DC

## 2018-07-14 NOTE — Telephone Encounter (Signed)
I received refill request for Chantix.  Did he stop tobacco, does he feel need to continue this medication a little longer?

## 2018-07-14 NOTE — Telephone Encounter (Signed)
Is this ok to refill?  

## 2018-07-14 NOTE — Telephone Encounter (Signed)
Patient states that he is doing good but has hit a wall and he needs medication for a little longer.   Please send to his pharmacy.

## 2018-07-14 NOTE — Telephone Encounter (Signed)
Med sent.

## 2018-08-19 ENCOUNTER — Telehealth: Payer: Self-pay

## 2018-08-19 ENCOUNTER — Other Ambulatory Visit: Payer: Self-pay | Admitting: Medical

## 2018-08-19 ENCOUNTER — Other Ambulatory Visit (INDEPENDENT_AMBULATORY_CARE_PROVIDER_SITE_OTHER): Payer: BLUE CROSS/BLUE SHIELD

## 2018-08-19 DIAGNOSIS — Z23 Encounter for immunization: Secondary | ICD-10-CM | POA: Diagnosis not present

## 2018-08-19 DIAGNOSIS — R911 Solitary pulmonary nodule: Secondary | ICD-10-CM

## 2018-08-19 NOTE — Telephone Encounter (Signed)
This is for you!

## 2018-08-19 NOTE — Telephone Encounter (Signed)
Spoke to pt and advised of Follow up ct scan was needed per his previous CT scan . Advised that a PA was needed and [facility] would be calling pt to make appt after PA was complete. KH

## 2018-08-19 NOTE — Telephone Encounter (Signed)
CT chest ordered.  Please schedule for repeat to recheck nodule from 05/2018 abnormal CT  Schedule him follow up visit here with me for a week after CT test

## 2018-08-19 NOTE — Telephone Encounter (Signed)
Patient came in to have shingles vaccine and wanted to know about the follow up CT chest.   Can you put the order in?   Thank you

## 2018-08-20 NOTE — Telephone Encounter (Signed)
CT ordered in epic and prior authorization is done.

## 2018-09-04 ENCOUNTER — Other Ambulatory Visit: Payer: Self-pay | Admitting: Medical

## 2018-09-04 DIAGNOSIS — R911 Solitary pulmonary nodule: Secondary | ICD-10-CM

## 2018-09-06 DIAGNOSIS — I7090 Unspecified atherosclerosis: Secondary | ICD-10-CM

## 2018-09-06 DIAGNOSIS — J449 Chronic obstructive pulmonary disease, unspecified: Secondary | ICD-10-CM

## 2018-09-06 DIAGNOSIS — I2510 Atherosclerotic heart disease of native coronary artery without angina pectoris: Secondary | ICD-10-CM

## 2018-09-06 HISTORY — DX: Unspecified atherosclerosis: I70.90

## 2018-09-06 HISTORY — DX: Atherosclerotic heart disease of native coronary artery without angina pectoris: I25.10

## 2018-09-06 HISTORY — DX: Chronic obstructive pulmonary disease, unspecified: J44.9

## 2018-09-08 ENCOUNTER — Ambulatory Visit
Admission: RE | Admit: 2018-09-08 | Discharge: 2018-09-08 | Disposition: A | Discharge status: Home / Self Care | Payer: BLUE CROSS/BLUE SHIELD | Source: Ambulatory Visit | Attending: Medical | Admitting: Medical

## 2018-09-08 DIAGNOSIS — J432 Centrilobular emphysema: Secondary | ICD-10-CM | POA: Diagnosis not present

## 2018-09-08 DIAGNOSIS — R911 Solitary pulmonary nodule: Secondary | ICD-10-CM

## 2018-09-11 ENCOUNTER — Ambulatory Visit: Payer: BLUE CROSS/BLUE SHIELD | Admitting: Medical

## 2018-09-11 ENCOUNTER — Encounter: Payer: Self-pay | Admitting: Medical

## 2018-09-11 VITALS — BP 140/90 | HR 66 | Temp 97.7°F | Resp 16 | Wt 239.4 lb

## 2018-09-11 DIAGNOSIS — Z720 Tobacco use: Secondary | ICD-10-CM

## 2018-09-11 DIAGNOSIS — R0989 Other specified symptoms and signs involving the circulatory and respiratory systems: Secondary | ICD-10-CM

## 2018-09-11 DIAGNOSIS — I2510 Atherosclerotic heart disease of native coronary artery without angina pectoris: Secondary | ICD-10-CM

## 2018-09-11 DIAGNOSIS — I700 Atherosclerosis of aorta: Secondary | ICD-10-CM

## 2018-09-11 DIAGNOSIS — R030 Elevated blood-pressure reading, without diagnosis of hypertension: Secondary | ICD-10-CM

## 2018-09-11 DIAGNOSIS — R05 Cough: Secondary | ICD-10-CM

## 2018-09-11 DIAGNOSIS — Z1211 Encounter for screening for malignant neoplasm of colon: Secondary | ICD-10-CM

## 2018-09-11 DIAGNOSIS — R490 Dysphonia: Secondary | ICD-10-CM

## 2018-09-11 DIAGNOSIS — R911 Solitary pulmonary nodule: Secondary | ICD-10-CM

## 2018-09-11 DIAGNOSIS — J439 Emphysema, unspecified: Secondary | ICD-10-CM

## 2018-09-11 DIAGNOSIS — R053 Chronic cough: Secondary | ICD-10-CM

## 2018-09-11 NOTE — Progress Notes (Signed)
Subjective:     Patient ID: Cameron Martens., male   DOB: 12-15-1960, 58 y.o.   MRN: [id number]  HPI Here for follow-up from recent CT scan. I last saw him in October 2019 to address pulmonary nodule on CT scan after doing a lung cancer screening.  He has a greater than 30-year pack history.  He also had decreased breath sounds and abnormal PFT last visit.  He used Anoro sample, although he did not think it helped all that much with breathing or cough.  He does get occasional cough.  He has completed a few months of Chantix and has cut way back on tobacco but still smoking about 12 cigarettes/day.  Using Nicorette gum currently.  Otherwise feels fine, in usual state of health, no dyspnea on exertion, get some walking for exercise.  Review of Systems   Past Medical History:  Diagnosis Date  . Allergy   . Arthritis   . Atherosclerosis 09/2018  . CAD (coronary artery disease) 09/2018  . COPD (chronic obstructive pulmonary disease) (HCC) 09/2018   emphysema, >30 pack year hx  . Smoker   . Wears glasses    Current Outpatient Medications on File Prior to Visit  Medication Sig Dispense Refill  . rosuvastatin (CRESTOR) 20 MG tablet Take 1 tablet (20 mg total) by mouth daily. (Patient not taking: Reported on 09/11/2018) 90 tablet 3   No current facility-administered medications on file prior to visit.    ROS as in subjective       Objective:   Physical Exam  General appearance: alert, no distress, WD/WN,  Neck: supple, no lymphadenopathy, no thyromegaly, no masses Heart: RRR, normal S1, S2, no murmurs Lungs: decreased breath sounds in general, no wheezes, rhonchi, or rales Pulses: 2+ symmetric, upper and lower extremities, normal cap refill        Assessment:     Encounter Diagnoses  Name Primary?  Marland Kitchen Aortic atherosclerosis (HCC) Yes  . Coronary artery disease involving native coronary artery of native heart without angina pectoris   . Abnormal lung sounds   .  Chronic cough   . Elevated blood pressure reading without diagnosis of hypertension   . Hoarse voice quality   . Tobacco use   . Screen for colon cancer   . Pulmonary nodule   . Pulmonary emphysema, unspecified emphysema type (HCC)        Plan:      I reviewed his CT chest from 09/2018.  He has significant notable coronary artery disease and atherosclerosis of the aorta.  He does not appear to be compliant with statin started before last visit in the fall 2019.  He does report working on improving exercise and diet and trying to cut down on tobacco.  We will refer to cardiology for further evaluation  Elevated blood pressure- it looks like we will need to pursue antihypertensive therapy.  We will defer this to cardiology since we are making a referral.  We do not have a lot of blood pressure readings to go on other than his recent visits here in the fall and today  Abnormal lung sounds, chronic cough, long history of smoking and abnormal CT and PFT's fall 2019 and this month.  We discussed diagnosis of COPD emphysema.  We discussed the need to stop smoking completely, discussed treatment recommendations.  He currently is asymptomatic.  He completed a trial of An oro inhaler without any significant changes in how he felt.  For now he will continue  efforts to stop smoking  Tobacco use disorder-improved, has cut back, continue Nicorette gum as needed, and continue efforts to quit smoking completely  Pulmonary nodule stable from 05/27/2018 CT versus 09/08/2018 CT  Screening for colon cancer- we will re-inquire about the Cologuard test as this has not been completed

## 2018-09-18 ENCOUNTER — Encounter: Payer: Self-pay | Admitting: Internal Medicine

## 2018-09-19 ENCOUNTER — Encounter: Payer: Self-pay | Admitting: Cardiovascular Disease

## 2018-09-19 ENCOUNTER — Ambulatory Visit (INDEPENDENT_AMBULATORY_CARE_PROVIDER_SITE_OTHER): Payer: BLUE CROSS/BLUE SHIELD | Admitting: Cardiovascular Disease

## 2018-09-19 VITALS — BP 110/80 | HR 82 | Ht 70.0 in | Wt 239.0 lb

## 2018-09-19 DIAGNOSIS — R0602 Shortness of breath: Secondary | ICD-10-CM

## 2018-09-19 DIAGNOSIS — Z720 Tobacco use: Secondary | ICD-10-CM

## 2018-09-19 DIAGNOSIS — I2510 Atherosclerotic heart disease of native coronary artery without angina pectoris: Secondary | ICD-10-CM | POA: Diagnosis not present

## 2018-09-19 NOTE — Patient Instructions (Signed)
Medication Instructions: Your physician recommends that you continue on your current medications as directed. Please refer to the Current Medication list given to you today.   Labwork: None today  Procedures/Testing: Your physician has requested that you have en exercise stress myoview. For further information please visit [email or web address]. Please follow instruction sheet, as given.   Follow-Up: 3 months with Dr.Koneswaran  Any Additional Special Instructions Will Be Listed Below (If Applicable).     If you need a refill on your cardiac medications before your next appointment, please call your pharmacy.   

## 2018-09-19 NOTE — Progress Notes (Signed)
CARDIOLOGY CONSULT NOTE  Patient ID: Cameron Mendoza. MRN: [id number] DOB/AGE: Feb 01, 1961 58 y.o.  Admit date: (Not on file) Primary Physician: Jac Canavan, PA-C Referring Physician: Jac Canavan, PA-C  Reason for Consultation: Coronary artery calcifications  HPI: Cameron Sacksteder. is a 58 y.o. male who is being seen today for the evaluation of coronary artery calcifications at the request of Genia Del.   I reviewed a chest CT dated 09/08/2018 which showed aortic atherosclerosis as well as atherosclerosis of the great vessels of the mediastinum and the coronary arteries including calcified atherosclerotic plaque in the left anterior descending coronary artery.  Past medical history includes tobacco use, COPD/emphysema, hyperlipidemia and elevated blood pressure.  I personally reviewed the ECG performed on 05/08/2018 which showed sinus rhythm with an incomplete right bundle branch block.  He told me this is his last week of smoking cigarettes.  He denies chest pain, palpitations, leg swelling, orthopnea, syncope, and paroxysmal external dyspnea.  He has some shortness of breath only if he heavily exerts himself.  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  Family history: Father had an MI at age 27.  He was a smoker and had diabetes.   Allergies  Allergen Reactions  . Penicillins Rash    Current Outpatient Medications  Medication Sig Dispense Refill  . rosuvastatin (CRESTOR) 20 MG tablet Take 1 tablet (20 mg total) by mouth daily. 90 tablet 3  . varenicline (CHANTIX) 1 MG tablet Take 1 mg by mouth 2 (two) times daily.     No current facility-administered medications for this visit.     Past Medical History:  Diagnosis Date  . Allergy   . Arthritis   . Atherosclerosis 09/2018  . CAD (coronary artery disease) 09/2018  . COPD (chronic obstructive  pulmonary disease) (HCC) 09/2018   emphysema, >30 pack year hx  . Smoker   . Wears glasses     Past Surgical History:  Procedure Laterality Date  . APPENDECTOMY    . CERVICAL FUSION  2009  . CHOLECYSTECTOMY  2009  . COLONOSCOPY     never, pending decision 05/2018  . LACERATION REPAIR    . TONSILLECTOMY AND ADENOIDECTOMY      Social History   Socioeconomic History  . Marital status: Married    Spouse name: Not on file  . Number of children: 4  . Years of education: Not on file  . Highest education level: Not on file  Occupational History  . Occupation: Retail banker    Comment: Engineer, water  Social Needs  . Financial resource strain: Not on file  . Food insecurity:    Worry: Not on file    Inability: Not on file  . Transportation needs:    Medical: Not on file    Non-medical: Not on file  Tobacco Use  . Smoking status: Current Every Day Smoker    Packs/day: 0.50    Years: 25.00    Pack years: 12.50  . Smokeless tobacco: Never Used  Substance and Sexual Activity  . Alcohol use: Yes    Alcohol/week: 2.0 standard drinks    Types: 2 Cans of beer per week    Comment: rare   . Drug use: Never  . Sexual activity: Not on file  Lifestyle  . Physical activity:    Days per week: 3 days  Minutes per session: 30 min  . Stress: Not on file  Relationships  . Social connections:    Talks on phone: Not on file    Gets together: Not on file    Attends religious service: Not on file    Active member of club or organization: Not on file    Attends meetings of clubs or organizations: Not on file    Relationship status: Not on file  . Intimate partner violence:    Fear of current or ex partner: Not on file    Emotionally abused: Not on file    Physically abused: Not on file    Forced sexual activity: Not on file  Other Topics Concern  . Not on file  Social History Narrative   [employer]      Current Meds  Medication Sig  . rosuvastatin (CRESTOR) 20 MG tablet  Take 1 tablet (20 mg total) by mouth daily.  . varenicline (CHANTIX) 1 MG tablet Take 1 mg by mouth 2 (two) times daily.      Review of systems complete and found to be negative unless listed above in HPI    Physical exam Blood pressure 110/80, pulse 82, height 5\' 10"  (1.778 m), weight 239 lb (108.4 kg), SpO2 97 %. General: NAD Neck: No JVD, no thyromegaly or thyroid nodule.  Lungs: Diminished breath sounds without crackles or wheezes. CV: Nondisplaced PMI. Regular rate and rhythm, normal S1/S2, no S3/S4, no murmur.  No peripheral edema.  No carotid bruit.    Abdomen: Soft, nontender, no distention.  Skin: Intact without lesions or rashes.  Neurologic: Alert and oriented x 3.  Psych: Normal affect. Extremities: No clubbing or cyanosis.  HEENT: Normal.   ECG: Most recent ECG reviewed.   Labs: Lab Results  Component Value Date/Time   K 4.6 05/08/2018 01:41 PM   BUN 10 05/08/2018 01:41 PM   CREATININE 1.23 05/08/2018 01:41 PM   ALT 21 05/08/2018 01:41 PM   HGB 15.5 05/08/2018 01:41 PM     Lipids: Lab Results  Component Value Date/Time   LDLCALC 108 (H) 05/08/2018 01:41 PM   CHOL 177 05/08/2018 01:41 PM   TRIG 115 05/08/2018 01:41 PM   HDL 46 05/08/2018 01:41 PM        ASSESSMENT AND PLAN:   1.  Coronary artery calcifications: He has some exertional dyspnea but only with significant levels of exertion.  This may well be related to his COPD from longstanding tobacco use.  Risk factors include hypercholesterolemia and tobacco use.  For further clarification, I will obtain an exercise Myoview.  2.  Tobacco abuse: He told me this is his last week of smoking cigarettes.  He is using Chantix.  3.  Hyperlipidemia: Lipids reviewed above.  Currently on rosuvastatin 20 mg.   Disposition: Follow up in 3 months  Signed: Prentice Docker, M.D., F.A.C.C.  09/19/2018, 12:21 PM

## 2018-09-22 DIAGNOSIS — M7501 Adhesive capsulitis of right shoulder: Secondary | ICD-10-CM | POA: Diagnosis not present

## 2018-09-22 DIAGNOSIS — M9902 Segmental and somatic dysfunction of thoracic region: Secondary | ICD-10-CM | POA: Diagnosis not present

## 2018-09-22 DIAGNOSIS — M9907 Segmental and somatic dysfunction of upper extremity: Secondary | ICD-10-CM | POA: Diagnosis not present

## 2018-09-25 ENCOUNTER — Encounter (HOSPITAL_COMMUNITY)
Admission: RE | Admit: 2018-09-25 | Discharge: 2018-09-25 | Disposition: A | Discharge status: Home / Self Care | Payer: BLUE CROSS/BLUE SHIELD | Source: Ambulatory Visit | Attending: Cardiovascular Disease | Admitting: Cardiovascular Disease

## 2018-09-25 ENCOUNTER — Encounter (HOSPITAL_BASED_OUTPATIENT_CLINIC_OR_DEPARTMENT_OTHER)
Admission: RE | Admit: 2018-09-25 | Discharge: 2018-09-25 | Disposition: A | Discharge status: Home / Self Care | Payer: BLUE CROSS/BLUE SHIELD | Source: Ambulatory Visit | Attending: Cardiovascular Disease | Admitting: Cardiovascular Disease

## 2018-09-25 ENCOUNTER — Encounter (HOSPITAL_COMMUNITY): Payer: Self-pay

## 2018-09-25 DIAGNOSIS — R0602 Shortness of breath: Secondary | ICD-10-CM

## 2018-09-25 LAB — NM MYOCAR MULTI W/SPECT W/WALL MOTION / EF
CHL CUP MPHR: 162 {beats}/min
CHL CUP NUCLEAR SDS: 2
CHL CUP NUCLEAR SSS: 3
CHL CUP RESTING HR STRESS: 66 {beats}/min
Estimated workload: 7 METS
Exercise duration (min): 5 min
Exercise duration (sec): 30 s
LHR: 0.31
LV sys vol: 56 mL
LVDIAVOL: 115 mL (ref 62–150)
Peak HR: 150 {beats}/min
Percent HR: 92 %
RPE: 16
SRS: 1
TID: 1.02

## 2018-09-25 MED ORDER — SODIUM CHLORIDE 0.9% FLUSH
INTRAVENOUS | Status: AC
  Administered 2018-09-25: 10 mL via INTRAVENOUS
  Filled 2018-09-25: qty 10

## 2018-09-25 MED ORDER — TECHNETIUM TC 99M TETROFOSMIN IV KIT
30.0000 | PACK | Freq: Once | INTRAVENOUS | Status: AC | PRN
  Administered 2018-09-25: 33 via INTRAVENOUS

## 2018-09-25 MED ORDER — REGADENOSON 0.4 MG/5ML IV SOLN
INTRAVENOUS | Status: AC
  Filled 2018-09-25: qty 5

## 2018-09-25 MED ORDER — TECHNETIUM TC 99M TETROFOSMIN IV KIT
10.0000 | PACK | Freq: Once | INTRAVENOUS | Status: AC | PRN
  Administered 2018-09-25: 10.7 via INTRAVENOUS

## 2018-10-07 ENCOUNTER — Other Ambulatory Visit: Payer: Self-pay | Admitting: Medical

## 2018-10-07 MED ORDER — SILDENAFIL CITRATE 100 MG PO TABS: 100.0000 mg | ORAL_TABLET | ORAL | 1 refills | Status: AC | PRN

## 2018-11-12 ENCOUNTER — Telehealth: Payer: BLUE CROSS/BLUE SHIELD | Admitting: Physician Assistant

## 2018-11-12 DIAGNOSIS — H9209 Otalgia, unspecified ear: Secondary | ICD-10-CM

## 2018-11-12 DIAGNOSIS — R059 Cough, unspecified: Secondary | ICD-10-CM

## 2018-11-12 DIAGNOSIS — R05 Cough: Secondary | ICD-10-CM

## 2018-11-12 NOTE — Progress Notes (Signed)
Duplicate, evisit done by another provider

## 2018-11-12 NOTE — Progress Notes (Signed)

## 2018-11-18 ENCOUNTER — Telehealth: Payer: Self-pay | Admitting: Medical

## 2018-11-18 NOTE — Telephone Encounter (Signed)
  Pt called he had an e visit last week ( he thought he was setting up visit with [place] but it wasn't) They told him he had uri, he thinks from the pollen They told him to use mucinex and tylenol He is still having problems and ear pain, he wanted to see if you could send in antibiotic   I was not sure if you wanted to set up virtual visit or prescribe based on e visit

## 2018-11-19 ENCOUNTER — Other Ambulatory Visit: Payer: Self-pay | Admitting: Medical

## 2018-11-19 MED ORDER — SULFAMETHOXAZOLE-TRIMETHOPRIM 800-160 MG PO TABS: 1.0000 | ORAL_TABLET | Freq: Two times a day (BID) | ORAL | 0 refills | Status: DC

## 2018-11-19 NOTE — Telephone Encounter (Signed)
Antibiotic "bactrim" sent given time frame of symptoms and current symptoms.   I reviewed the e-visit notes.   E-visits are done through a separate entity.  In the future I encourage him to use [place] first whether it be my chart visit electronic, regular in office visit or possible virtual visit if insurance allows [place] to continue this option.  E-visit is an option but its not managed by our office.

## 2018-11-19 NOTE — Telephone Encounter (Signed)
Patient notified

## 2019-01-13 ENCOUNTER — Ambulatory Visit: Payer: BLUE CROSS/BLUE SHIELD | Admitting: Cardiovascular Disease

## 2019-01-13 ENCOUNTER — Other Ambulatory Visit: Payer: Self-pay

## 2019-04-28 ENCOUNTER — Other Ambulatory Visit: Payer: Self-pay

## 2019-04-28 DIAGNOSIS — Z20822 Contact with and (suspected) exposure to covid-19: Secondary | ICD-10-CM

## 2019-04-30 ENCOUNTER — Encounter: Payer: Self-pay | Admitting: Family Medicine

## 2019-04-30 ENCOUNTER — Other Ambulatory Visit: Payer: Self-pay

## 2019-04-30 ENCOUNTER — Ambulatory Visit (INDEPENDENT_AMBULATORY_CARE_PROVIDER_SITE_OTHER): Payer: BC Managed Care – PPO | Admitting: Family Medicine

## 2019-04-30 VITALS — HR 88 | Temp 97.8°F | Ht 70.0 in | Wt 216.0 lb

## 2019-04-30 DIAGNOSIS — Z720 Tobacco use: Secondary | ICD-10-CM

## 2019-04-30 DIAGNOSIS — J069 Acute upper respiratory infection, unspecified: Secondary | ICD-10-CM

## 2019-04-30 LAB — NOVEL CORONAVIRUS, NAA: SARS-CoV-2, NAA: NOT DETECTED

## 2019-04-30 NOTE — Progress Notes (Signed)
Start time: 3:16--didn't start until 3:20, technical issues End time: 3:35  CHANGED TO TELEPHONE ENCOUNTER--PT NOT ABLE TO GET VIDEO TO WORK  Virtual Visit via telephone Note  I connected with Cameron Mendoza. on 04/30/19  By telephone and verified that I am speaking with the correct person using two identifiers.  Location: Patient: alone, in a business (back room) Provider: office   I discussed the limitations of evaluation and management by telemedicine and the availability of in person appointments. The patient expressed understanding and agreed to proceed.  History of Present Illness:  Chief Complaint  Patient presents with  . Cough    runny nose, sneezing, aching and fever since last Sunday. Got tested for COVID Tues and it was negative. No fever since yesterday. Wheezing and discolored, yellow mucus. Was taking Theraflu, has not taken any today.    Symptoms started 9/20 with sneezing, runny nose. Later that night he had chills.  9/21 he woke up with aching, coughing.  Fever developed on 9/22, max of 101.5.   COVID test done 9/22, negative He is feeling a little better--still feels tired.  Sneezing resolved.  He is blowing out yellow drainage, feels a little wheezy in the upper part of his chest at times.  It is loosening up some, and is coughing up yellow phlegm. No fever since yesterday.  He used Mucinex DM the first 2 days, then switched to Theraflu, which helped more.  Last dose was 7pm last night. No medications today. He denies sinus pain.  He is a smoker  PMH, PSH, SH reviewed Not currently taking any medications  Allergies  Allergen Reactions  . Penicillins Rash   ROS:  Fever and URI symptoms per HPI. No nausea, vomiting, diarrhea.  He had some loss of smell/taste, but taste is starting to return. No rashes, bleeding, bruising. No shortness of breath, chest pain.   Observations/Objective: Pulse 88   Temp 97.8 F (36.6 C) (Temporal)   Ht '5\' 10"'   (1.778 m)   Wt 216 lb (98 kg)   SpO2 96%   BMI 30.99 kg/m   Patient is alert, oriented, good historian. He is speaking easily, in no distress.  No coughing during conversation. Exam is limited due to telephone nature of the visit.   Assessment and Plan:  Viral upper respiratory tract infection - Reviewed normal course, seems to be improving. Smoker--discussed s/sx bacterial infection and f/u if worsens. Supportive measures reviewed  Tobacco use - counseled and encouraged cessation   Try using sinus rinses once or twice daily--Neti-Pot or Sinus Rinse Kit. Restart using Mucinex. Please try and quit smoking, which increases your risk for bacterial infections. If your symptoms persist or worsen over the next week, please contact us again.  If you have fever, sinus pain, worsening cough, shortness of breath, you may need further evaluation and/or antibiotics.   Due for CPE with Audelia Acton in October.  Follow Up Instructions:    I discussed the assessment and treatment plan with the patient. The patient was provided an opportunity to ask questions and all were answered. The patient agreed with the plan and demonstrated an understanding of the instructions.   The patient was advised to call back or seek an in-person evaluation if the symptoms worsen or if the condition fails to improve as anticipated.  I provided 15 minutes of non-face-to-face time during this encounter.   Vikki Ports, MD

## 2019-04-30 NOTE — Patient Instructions (Signed)
Try using sinus rinses once or twice daily--Neti-Pot or Sinus Rinse Kit. Restart using Mucinex. Please try and quit smoking, which increases your risk for bacterial infections. If your symptoms persist or worsen over the next week, please contact us again.  If you have fever, sinus pain, worsening cough, shortness of breath, you may need further evaluation and/or antibiotics.  Please try and quit smoking--start thinking about why/when you smoke (habit, boredom, stress) in order to come up with effective strategies to cut back or quit. Available resources to help you quit include free counseling through [facility] ([place]Quitline.[facility] or 1-800-QUITNOW), smoking cessation classes through [facility] (call to find out schedule), over-the-counter nicotine replacements, and e-cigarettes (although this may not help break the hand-mouth habit).  Many insurance companies also have smoking cessation programs (which may decrease the cost of patches, meds if enrolled).  If these methods are not effective for you, and you are motivated to quit, return to discuss the possibility of prescription medications.  You are due for a physical with Audelia Acton.  If you don't get a call from the office to schedule this, please call next week.  Please contact us next week if symptoms are getting worse--hopefully you will be feeling much better by then!

## 2019-05-01 NOTE — Progress Notes (Signed)
Called and left voice mail

## 2019-05-30 ENCOUNTER — Other Ambulatory Visit: Payer: Self-pay | Admitting: Family Medicine

## 2019-05-30 MED ORDER — CLARITHROMYCIN 500 MG PO TABS: 500.0000 mg | ORAL_TABLET | Freq: Two times a day (BID) | ORAL | 0 refills | Status: AC

## 2019-05-30 NOTE — Progress Notes (Signed)
C/o of tooth ache .Will call in e mycin and have him call his dentist on Monday

## 2021-01-03 IMAGING — CT CT CHEST LCS NODULE FOLLOW-UP W/O CM
2 of 5 series · 15 of 40 positions shown, 18 images · non-contrast
Comparison: Low-dose lung cancer screening chest CT 05/27/2018.

CLINICAL DATA: 57-year-old male current smoker with 45 pack-year
history of smoking. Follow-up for prior abnormal low-dose lung
cancer screening chest CT.

EXAM:
CT CHEST WITHOUT CONTRAST FOR LUNG CANCER SCREENING NODULE FOLLOW-UP
TECHNIQUE: Multidetector CT imaging of the chest was performed following the
standard protocol without IV contrast.

[Series 4: lcs f/u 1.00 br60 s3 lung · axial · 0.84mm/px · z∈[-946,-622]mm · 12 of 358 slices shown, 15 images]
[im 17/358  mediastinal]
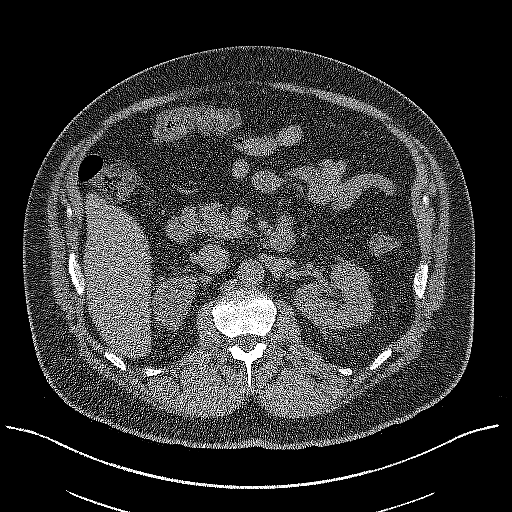
[im 17/358  lung]
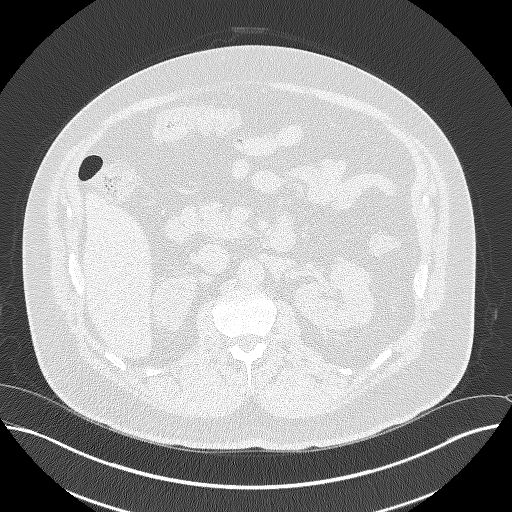
[im 49/358  lung]
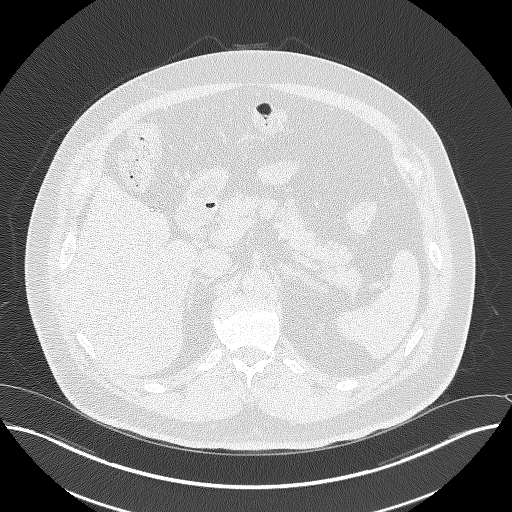
[im 82/358  lung]
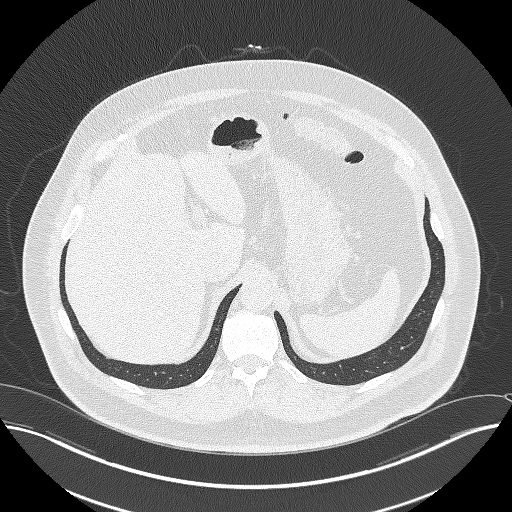
[im 114/358  lung]
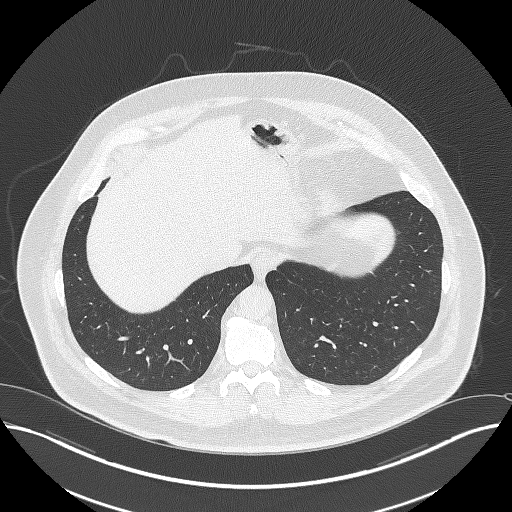
[im 130/358  mediastinal]
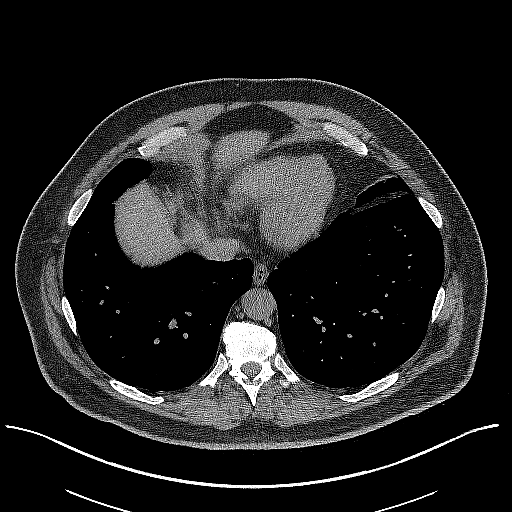
[im 130/358  lung]
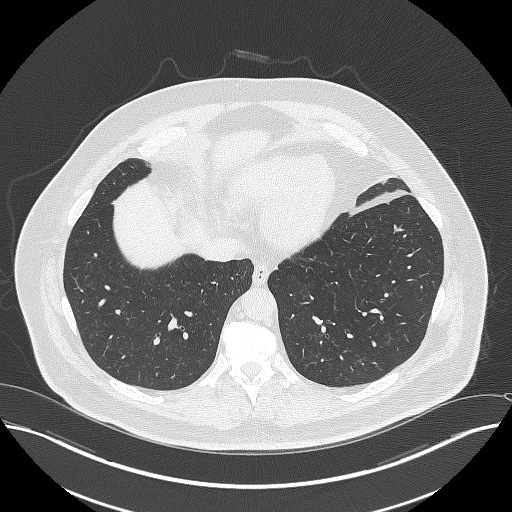
[im 163/358  lung]
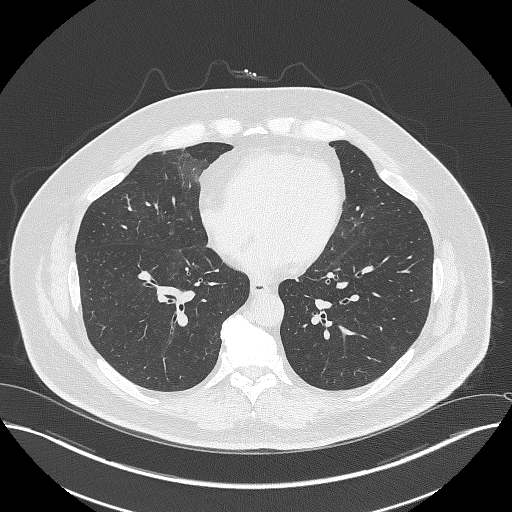
[im 195/358  lung]
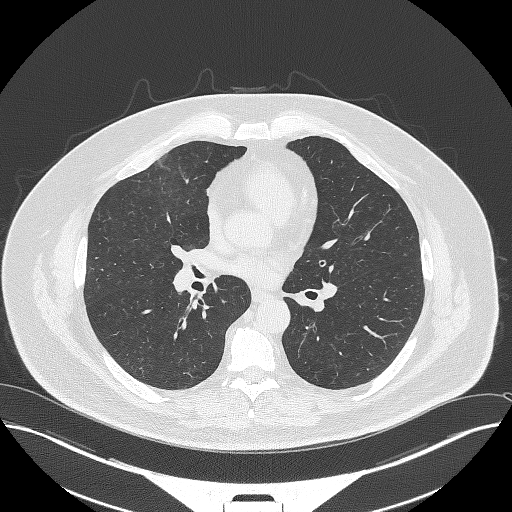
[im 228/358  lung]
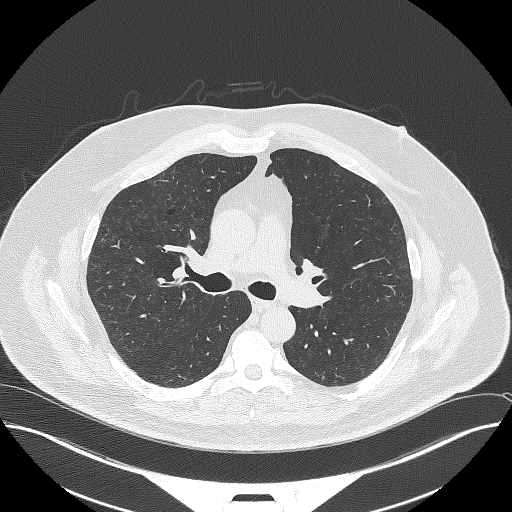
[im 244/358  mediastinal]
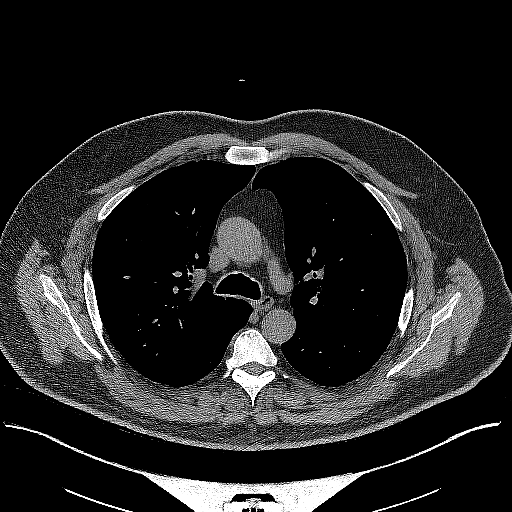
[im 244/358  lung]
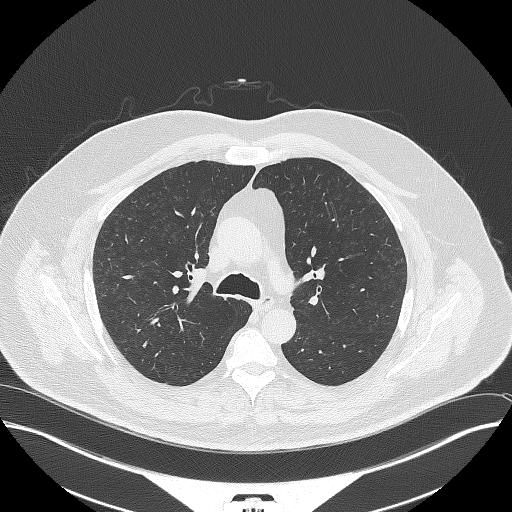
[im 276/358  lung]
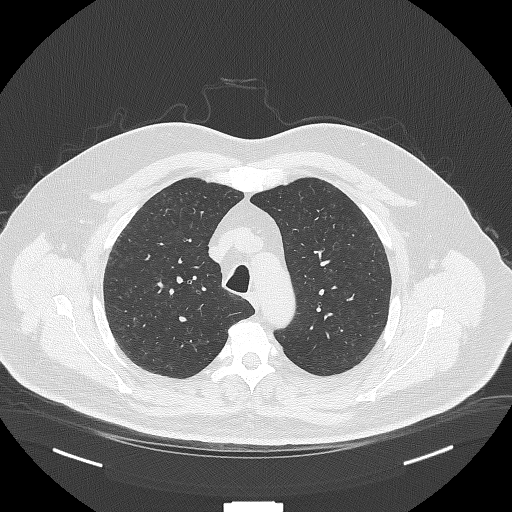
[im 309/358  lung]
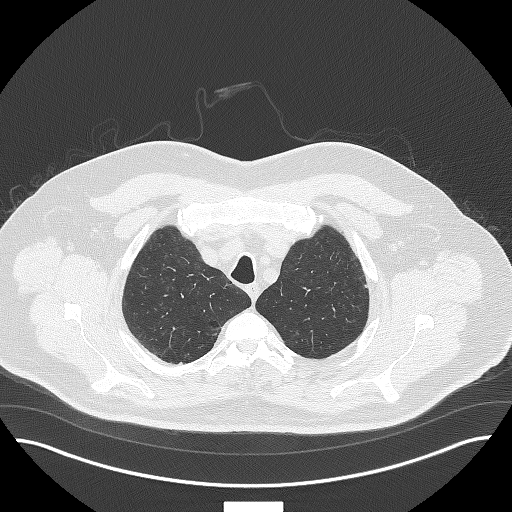
[im 341/358  lung]
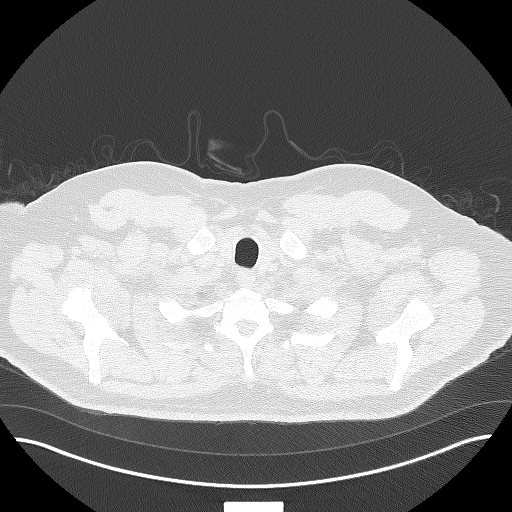

[Series 5: lcs f/u 1.00 br44 s3 cor · coronal · 0.70mm/px · 3 of 352 slices shown]
[im 71/352  lung]
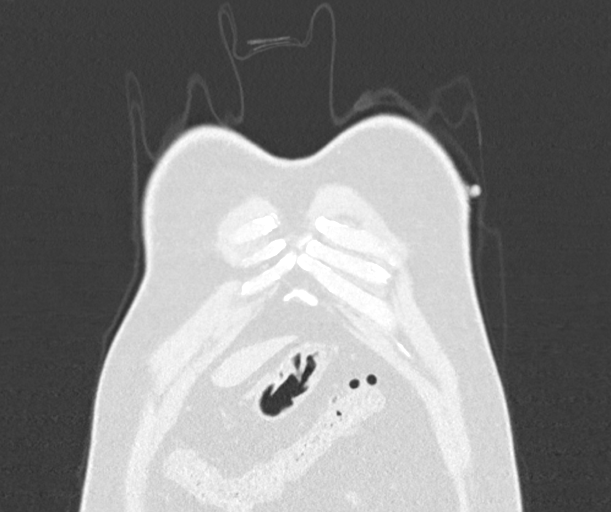
[im 141/352  lung]
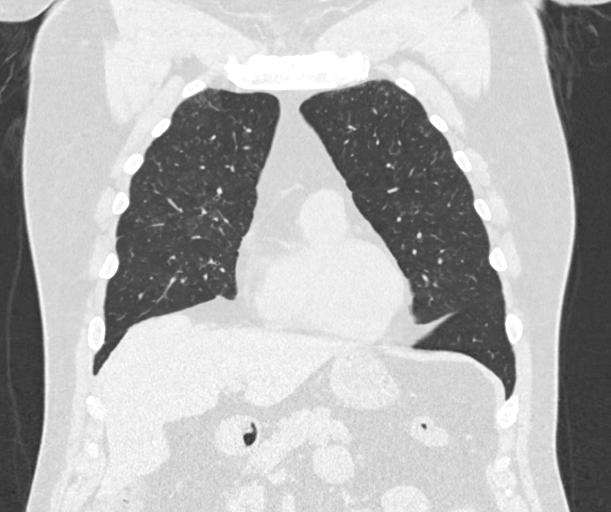
[im 211/352  lung]
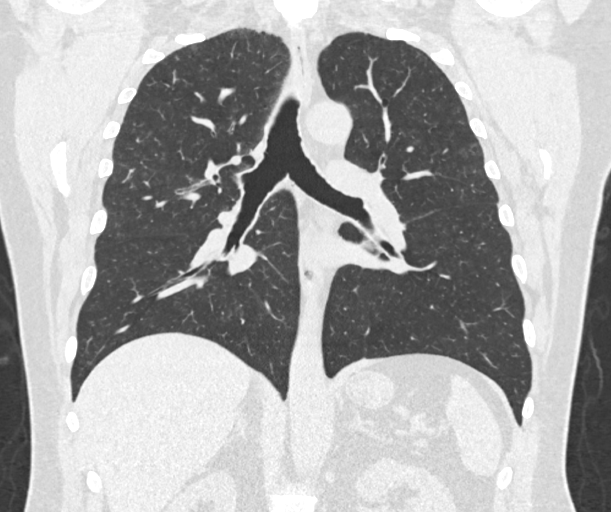

[15 of 40 positions shown; findings below may reference images not displayed]

FINDINGS: Cardiovascular: Heart size is normal. There is no significant
pericardial fluid, thickening or pericardial calcification. There is
aortic atherosclerosis, as well as atherosclerosis of the great
vessels of the mediastinum and the coronary arteries, including
calcified atherosclerotic plaque in the left anterior descending
coronary artery.

Mediastinum/Nodes: No pathologically enlarged mediastinal or hilar
lymph nodes. Esophagus is unremarkable in appearance. No axillary
lymphadenopathy.

Lungs/Pleura: The previously noted nodule of concern in the left
upper lobe (axial image 189 of series 3), with a volume derived mean
diameter of 9.1 mm is stable compared to the prior study. Several
other smaller pulmonary nodules are noted. No other larger more
suspicious appearing pulmonary nodules or masses are noted. No acute
consolidative airspace disease. No pleural effusions. Mild diffuse
bronchial wall thickening with mild centrilobular and paraseptal
emphysema.

Upper Abdomen: Status post cholecystectomy.

Musculoskeletal: There are no aggressive appearing lytic or blastic
lesions noted in the visualized portions of the skeleton.
IMPRESSION: 1. Lung-RADS 2S, benign appearance or behavior. Continue annual
screening with low-dose chest CT without contrast in 12 months.
2. The "S" modifier above refers to potentially clinically
significant non lung cancer related findings. Specifically, there is
aortic atherosclerosis, in addition to left anterior descending
coronary artery disease. Please note that although the presence of
coronary artery calcium documents the presence of coronary artery
disease, the severity of this disease and any potential stenosis
cannot be assessed on this non-gated CT examination. Assessment for
potential risk factor modification, dietary therapy or pharmacologic
therapy may be warranted, if clinically indicated.
3. Mild diffuse bronchial wall thickening with mild centrilobular
and paraseptal emphysema; imaging findings suggestive of underlying
COPD.

Aortic Atherosclerosis (O29BT-655.5) and Emphysema (O29BT-65L.4).

## 2022-04-11 ENCOUNTER — Encounter: Payer: Self-pay | Admitting: Internal Medicine

## 2022-05-15 ENCOUNTER — Encounter: Payer: Self-pay | Admitting: Internal Medicine

## 2022-10-08 ENCOUNTER — Telehealth: Payer: Self-pay | Admitting: Medical

## 2022-10-08 NOTE — Telephone Encounter (Signed)
[facility] records request forwarded to CONE [facility].

## 2022-12-25 LAB — COLOGUARD: COLOGUARD: POSITIVE — AB

## 2024-04-01 ENCOUNTER — Other Ambulatory Visit (HOSPITAL_COMMUNITY)
Admission: RE | Admit: 2024-04-01 | Discharge: 2024-04-01 | Disposition: A | Discharge status: Home / Self Care | Payer: Self-pay | Source: Ambulatory Visit | Attending: Oncology | Admitting: Oncology

## 2024-04-01 ENCOUNTER — Other Ambulatory Visit: Payer: Self-pay | Admitting: Medical Genetics

## 2024-04-20 LAB — GENECONNECT MOLECULAR SCREEN: Genetic Analysis Overall Interpretation: NEGATIVE

## 2024-04-29 ENCOUNTER — Other Ambulatory Visit: Payer: Self-pay

## 2024-04-29 ENCOUNTER — Encounter (HOSPITAL_COMMUNITY): Payer: Self-pay

## 2024-04-29 ENCOUNTER — Emergency Department (HOSPITAL_COMMUNITY)
Admission: EM | Admit: 2024-04-29 | Discharge: 2024-04-29 | Disposition: A | Discharge status: Home / Self Care | Attending: Emergency Medicine | Admitting: Emergency Medicine

## 2024-04-29 ENCOUNTER — Emergency Department (HOSPITAL_COMMUNITY)

## 2024-04-29 DIAGNOSIS — F17200 Nicotine dependence, unspecified, uncomplicated: Secondary | ICD-10-CM | POA: Insufficient documentation

## 2024-04-29 DIAGNOSIS — I2510 Atherosclerotic heart disease of native coronary artery without angina pectoris: Secondary | ICD-10-CM | POA: Insufficient documentation

## 2024-04-29 DIAGNOSIS — J449 Chronic obstructive pulmonary disease, unspecified: Secondary | ICD-10-CM | POA: Insufficient documentation

## 2024-04-29 DIAGNOSIS — R0789 Other chest pain: Secondary | ICD-10-CM | POA: Insufficient documentation

## 2024-04-29 LAB — CBC WITH DIFFERENTIAL/PLATELET
Abs Immature Granulocytes: 0.01 K/uL (ref 0.00–0.07)
Basophils Absolute: 0 K/uL (ref 0.0–0.1)
Basophils Relative: 1 %
Eosinophils Absolute: 0.2 K/uL (ref 0.0–0.5)
Eosinophils Relative: 3 %
HCT: 42.9 % (ref 39.0–52.0)
Hemoglobin: 14.6 g/dL (ref 13.0–17.0)
Immature Granulocytes: 0 %
Lymphocytes Relative: 35 %
Lymphs Abs: 2 K/uL (ref 0.7–4.0)
MCH: 33.3 pg (ref 26.0–34.0)
MCHC: 34 g/dL (ref 30.0–36.0)
MCV: 97.7 fL (ref 80.0–100.0)
Monocytes Absolute: 0.6 K/uL (ref 0.1–1.0)
Monocytes Relative: 11 %
Neutro Abs: 2.9 K/uL (ref 1.7–7.7)
Neutrophils Relative %: 50 %
Platelets: 203 K/uL (ref 150–400)
RBC: 4.39 MIL/uL (ref 4.22–5.81)
RDW: 13.5 % (ref 11.5–15.5)
WBC: 5.6 K/uL (ref 4.0–10.5)
nRBC: 0 % (ref 0.0–0.2)

## 2024-04-29 LAB — COMPREHENSIVE METABOLIC PANEL WITH GFR
ALT: 18 U/L (ref 0–44)
AST: 15 U/L (ref 15–41)
Albumin: 3.6 g/dL (ref 3.5–5.0)
Alkaline Phosphatase: 57 U/L (ref 38–126)
Anion gap: 8 (ref 5–15)
BUN: 11 mg/dL (ref 8–23)
CO2: 26 mmol/L (ref 22–32)
Calcium: 8.7 mg/dL — ABNORMAL LOW (ref 8.9–10.3)
Chloride: 104 mmol/L (ref 98–111)
Creatinine, Ser: 0.96 mg/dL (ref 0.61–1.24)
GFR, Estimated: >60 mL/min (ref >60)
Glucose, Bld: 90 mg/dL (ref 70–99)
Potassium: 3.9 mmol/L (ref 3.5–5.1)
Sodium: 138 mmol/L (ref 135–145)
Total Bilirubin: 1.3 mg/dL — ABNORMAL HIGH (ref 0.0–1.2)
Total Protein: 7.4 g/dL (ref 6.5–8.1)

## 2024-04-29 LAB — TROPONIN I (HIGH SENSITIVITY)
Troponin I (High Sensitivity): <2 ng/L (ref <18)
Troponin I (High Sensitivity): <2 ng/L (ref <18)

## 2024-04-29 MED ORDER — NAPROXEN 500 MG PO TABS: 500.0000 mg | ORAL_TABLET | Freq: Two times a day (BID) | ORAL | 0 refills | Status: AC

## 2024-04-29 NOTE — ED Notes (Signed)
Pt is ambulatory.  

## 2024-04-29 NOTE — ED Triage Notes (Signed)
 Pt arrived via POV c/o new onset chest pain that began this morning. Pt reports taking a baby aspirin PTA w/o relief. Pt describes chest pain as if someone is poking me in my chest. Pt endorses feeling lightheaded as well.

## 2024-04-29 NOTE — Discharge Instructions (Signed)
 Your chest x-ray, EKG and labs including both troponins are negative for acute findings.  It is possible this is chest wall pain or pleurisy especially given your recent upper respiratory infection.  I am placing you on an anti-inflammatory which should help your symptoms if it is pleurisy.  I am also referring you to Dr. Debera our local cardiologist for follow-up care, you may benefit from a repeat cardiac stress testing, they should be reaching out to you within the next 1 to 2 days to arrange an office visit.

## 2024-04-29 NOTE — ED Provider Notes (Signed)
 [place] EMERGENCY DEPARTMENT AT [facility] Provider Note   CSN: [id number] Arrival date & time: 04/29/24  1109     Patient presents with: Chest Pain   Cameron Mendoza. is a 63 y.o. male with a history of CAD, COPD, arthritis who continues to smoke daily presenting with chest pain which began this morning while resting in his home.  He describes a localized poking sensation at his left lower sternum which is intermittent lasting 1 to 2 seconds several times in a row and then resolves completely.  He reports multiple episodes of this prior to arrival.  He denies shortness of breath but recently recovered from a viral URI and had had an increased cough recently due to this infection.  He denies palpitations, nausea or vomiting, there is no radiation of pain.  He had an episode of lightheadedness with a cough episode prior to arrival which is not currently present.  He took a baby aspirin prior to arrival, no relief from this symptom.  Currently asymptomatic however.  The pain is not worse with deep inspiration but he can reproduce it by pressing on the site.   The history is provided by the patient and the spouse.       Prior to Admission medications   Medication Sig Start Date End Date Taking? Authorizing Provider  clarithromycin  (BIAXIN ) 500 MG tablet Take 1 tablet (500 mg total) by mouth 2 (two) times daily. 05/30/19   Joyce Norleen BROCKS, MD  rosuvastatin  (CRESTOR ) 20 MG tablet Take 1 tablet (20 mg total) by mouth daily. Patient not taking: Reported on 04/30/2019 05/29/18   Tysinger, Alm RAMAN, PA-C  sildenafil  (VIAGRA ) 100 MG tablet Take 1 tablet (100 mg total) by mouth as needed for up to 15 days for erectile dysfunction. 10/07/18 10/22/18  Tysinger, Alm RAMAN, PA-C    Allergies: Penicillins    Review of Systems  Constitutional:  Negative for fever.  HENT:  Negative for congestion and sore throat.   Eyes: Negative.   Respiratory:  Positive for cough. Negative for chest  tightness and shortness of breath.   Cardiovascular:  Positive for chest pain. Negative for palpitations and leg swelling.  Gastrointestinal:  Negative for abdominal pain, nausea and vomiting.  Genitourinary: Negative.   Musculoskeletal:  Negative for arthralgias, joint swelling and neck pain.  Skin: Negative.  Negative for rash and wound.  Neurological:  Negative for dizziness, weakness, light-headedness, numbness and headaches.  Psychiatric/Behavioral: Negative.      Updated Vital Signs BP 125/84   Pulse 62   Temp 98.1 F (36.7 C) (Oral)   Resp 10   Ht 5' 10 (1.778 m)   Wt 98 kg   SpO2 98%   BMI 31.00 kg/m   Physical Exam Vitals and nursing note reviewed.  Constitutional:      Appearance: He is well-developed.  HENT:     Head: Normocephalic and atraumatic.  Eyes:     Conjunctiva/sclera: Conjunctivae normal.  Cardiovascular:     Rate and Rhythm: Normal rate and regular rhythm.     Heart sounds: Normal heart sounds.  Pulmonary:     Effort: Pulmonary effort is normal.     Breath sounds: Normal breath sounds. No wheezing, rhonchi or rales.  Chest:     Chest wall: Tenderness present.     Comments: Reproducible tenderness to palpation left lower sternal  border. Abdominal:     General: Bowel sounds are normal.     Palpations: Abdomen is soft.  Tenderness: There is no abdominal tenderness.  Musculoskeletal:        General: Normal range of motion.     Cervical back: Normal range of motion.  Skin:    General: Skin is warm and dry.  Neurological:     Mental Status: He is alert.     (all labs ordered are listed, but only abnormal results are displayed) Labs Reviewed  COMPREHENSIVE METABOLIC PANEL WITH GFR - Abnormal; Notable for the following components:      Result Value   Calcium  8.7 (*)    Total Bilirubin 1.3 (*)    All other components within normal limits  CBC WITH DIFFERENTIAL/PLATELET  TROPONIN I (HIGH SENSITIVITY)  TROPONIN I (HIGH SENSITIVITY)     EKG: EKG Interpretation Date/Time:  Wednesday April 29 2024 11:18:23 EDT Ventricular Rate:  77 PR Interval:  176 QRS Duration:  102 QT Interval:  374 QTC Calculation: 423 R Axis:   33  Text Interpretation: Normal sinus rhythm with sinus arrhythmia Incomplete right bundle branch block Possible Lateral infarct , age undetermined Abnormal ECG When compared with ECG of 03-Nov-2008 13:40, No significant change was found Confirmed by Towana Sharper [phone]) on 04/29/2024 11:28:54 AM  Radiology: DG Chest 2 View Result Date: 04/29/2024 CLINICAL DATA:  Chest pain EXAM: CHEST - 2 VIEW COMPARISON:  Chest CT September 08, 2018 FINDINGS: The heart size and mediastinal contours are within normal limits. Both lungs are clear. Multilevel degenerative changes of the spine. IMPRESSION: No active cardiopulmonary disease. Electronically Signed   By: Megan  Zare M.D.   On: 04/29/2024 12:36     Procedures   Medications Ordered in the ED - No data to display                                  Medical Decision Making Patient presenting with localizing pain which is reproducible to palpation left sternal border, patient recently had a URI where he had increased coughing beyond his baseline daily cough secondary to his COPD.  Differential diagnosis includes ACS, however his symptoms are very atypical for this presentation, this could also represent pneumonia, pleurisy, PE although doubt given reproducibility and normal vital signs without hypoxia tachypnea or tachycardia during his stay.  Workup today is reassuring, given reproducible nature I suspect this is body wall strain secondary to cough.  However given his history of CAD, he was given an ambulatory referral to cardiology, his last cardiology screening was in 2020 where he had a nuclear medicine stress test with low risk results.  Amount and/or Complexity of Data Reviewed Labs: ordered.    Details: Labs are reassuring with negative delta troponins,  c-Met CBC reviewed and unremarkable. Radiology: ordered.    Details: Chest x-ray clear, no pneumonia, agree with interpretation. ECG/medicine tests: ordered.    Details: EKG Vent rate 77 normal sinus rhythm with arrhythmia and incomplete right bundle branch block, unchanged in comparison to prior EKG        Final diagnoses:  Atypical chest pain    ED Discharge Orders          Ordered    Ambulatory referral to Cardiology        04/29/24 1423               Birdena Mliss RIGGERS 05/02/24 1127    Towana Sharper BROCKS, MD 05/04/24 1136

## 2024-07-09 ENCOUNTER — Ambulatory Visit: Admitting: Cardiology

## 2024-10-01 ENCOUNTER — Ambulatory Visit: Admitting: Cardiology
